# Patient Record
Sex: Male | Born: 1975
Health system: Southern US, Community
[De-identification: ages and names within clinical notes are randomized; demographics above are authoritative.]

## PROBLEM LIST (undated history)

## (undated) DIAGNOSIS — R112 Nausea with vomiting, unspecified: Secondary | ICD-10-CM

## (undated) DIAGNOSIS — R51 Headache: Secondary | ICD-10-CM

## (undated) DIAGNOSIS — Z87442 Personal history of urinary calculi: Secondary | ICD-10-CM

## (undated) DIAGNOSIS — T8859XA Other complications of anesthesia, initial encounter: Secondary | ICD-10-CM

## (undated) DIAGNOSIS — T4145XA Adverse effect of unspecified anesthetic, initial encounter: Secondary | ICD-10-CM

## (undated) DIAGNOSIS — G4730 Sleep apnea, unspecified: Secondary | ICD-10-CM

## (undated) DIAGNOSIS — Z9889 Other specified postprocedural states: Secondary | ICD-10-CM

## (undated) DIAGNOSIS — Z6826 Body mass index (BMI) 26.0-26.9, adult: Secondary | ICD-10-CM

## (undated) DIAGNOSIS — K219 Gastro-esophageal reflux disease without esophagitis: Secondary | ICD-10-CM

## (undated) DIAGNOSIS — M109 Gout, unspecified: Secondary | ICD-10-CM

## (undated) DIAGNOSIS — R519 Headache, unspecified: Secondary | ICD-10-CM

## (undated) HISTORY — DX: Body mass index (BMI) 26.0-26.9, adult: Z68.26

---

## 2007-11-01 ENCOUNTER — Emergency Department: Payer: Self-pay | Admitting: Emergency Medicine

## 2007-11-02 ENCOUNTER — Ambulatory Visit: Payer: Self-pay

## 2013-09-04 ENCOUNTER — Ambulatory Visit: Payer: Self-pay

## 2015-03-08 ENCOUNTER — Other Ambulatory Visit: Payer: Self-pay | Admitting: Physician Assistant

## 2015-03-08 DIAGNOSIS — M25511 Pain in right shoulder: Secondary | ICD-10-CM

## 2015-06-19 ENCOUNTER — Other Ambulatory Visit: Payer: Self-pay | Admitting: Orthopedic Surgery

## 2015-06-19 ENCOUNTER — Encounter: Payer: Self-pay | Admitting: *Deleted

## 2015-06-19 ENCOUNTER — Other Ambulatory Visit: Payer: Self-pay

## 2015-06-19 NOTE — Patient Instructions (Signed)
  Your procedure is scheduled on: 06-27-15 (THURSDAY) Report to [place] To find out your arrival time please call [phone] between 1PM - 3PM on 06-26-15 [facility])  Remember: Instructions that are not followed completely may result in serious medical risk, up to and including death, or upon the discretion of your surgeon and anesthesiologist your surgery may need to be rescheduled.    _X___ 1. Do not eat food or drink liquids after midnight. No gum chewing or hard candies.     _X___ 2. No Alcohol for 24 hours before or after surgery.   ____ 3. Bring all medications with you on the day of surgery if instructed.    _X___ 4. Notify your doctor if there is any change in your medical condition     (cold, fever, infections).     Do not wear jewelry, make-up, hairpins, clips or nail polish.  Do not wear lotions, powders, or perfumes. You may wear deodorant.  Do not shave 48 hours prior to surgery. Men may shave face and neck.  Do not bring valuables to the hospital.    [facility] is not responsible for any belongings or valuables.               Contacts, dentures or bridgework may not be worn into surgery.  Leave your suitcase in the car. After surgery it may be brought to your room.  For patients admitted to the hospital, discharge time is determined by your treatment team.   Patients discharged the day of surgery will not be allowed to drive home.   Please read over the following fact sheets that you were given:      ____ Take these medicines the morning of surgery with A SIP OF WATER:    1. NONE  2.   3.   4.  5.  6.  ____ Fleet Enema (as directed)   _X___ Use CHG Soap as directed  ____ Use inhalers on the day of surgery  ____ Stop metformin 2 days prior to surgery    ____ Take 1/2 of usual insulin dose the night before surgery and none on the morning of surgery.   ____ Stop Coumadin/Plavix/aspirin-N/A  ____ Stop  Anti-inflammatories-NO NSAIDS OR ASPIRIN PRODUCTS-TYLENOL OK TO TAKE   _X___ Stop supplements until after surgery-STOP VITAMIN C NOW  ____ Bring C-Pap to the hospital.

## 2015-06-20 ENCOUNTER — Encounter
Admission: RE | Admit: 2015-06-20 | Discharge: 2015-06-20 | Disposition: A | Discharge status: Home / Self Care | Payer: Worker's Compensation | Source: Ambulatory Visit | Attending: Orthopedic Surgery | Admitting: Orthopedic Surgery

## 2015-06-20 DIAGNOSIS — Z01812 Encounter for preprocedural laboratory examination: Secondary | ICD-10-CM | POA: Insufficient documentation

## 2015-06-20 LAB — PROTIME-INR
INR: 1.14
PROTHROMBIN TIME: 14.8 s (ref 11.4–15.0)

## 2015-06-20 LAB — CBC WITH DIFFERENTIAL/PLATELET
BASOS ABS: 0 10*3/uL (ref 0–0.1)
BASOS PCT: 1 %
EOS ABS: 0.3 10*3/uL (ref 0–0.7)
Eosinophils Relative: 3 %
HEMATOCRIT: 42.5 % (ref 40.0–52.0)
HEMOGLOBIN: 14.7 g/dL (ref 13.0–18.0)
Lymphocytes Relative: 24 %
Lymphs Abs: 2.2 10*3/uL (ref 1.0–3.6)
MCH: 29 pg (ref 26.0–34.0)
MCHC: 34.7 g/dL (ref 32.0–36.0)
MCV: 83.7 fL (ref 80.0–100.0)
MONOS PCT: 7 %
Monocytes Absolute: 0.6 10*3/uL (ref 0.2–1.0)
NEUTROS ABS: 5.9 10*3/uL (ref 1.4–6.5)
NEUTROS PCT: 65 %
Platelets: 305 10*3/uL (ref 150–440)
RBC: 5.08 MIL/uL (ref 4.40–5.90)
RDW: 12.6 % (ref 11.5–14.5)
WBC: 9 10*3/uL (ref 3.8–10.6)

## 2015-06-20 LAB — BASIC METABOLIC PANEL
Anion gap: 9 (ref 5–15)
BUN: 13 mg/dL (ref 6–20)
CALCIUM: 9.5 mg/dL (ref 8.9–10.3)
CO2: 27 mmol/L (ref 22–32)
Chloride: 103 mmol/L (ref 101–111)
Creatinine, Ser: 1.02 mg/dL (ref 0.61–1.24)
GFR calc Af Amer: >60 mL/min (ref >60)
Glucose, Bld: 107 mg/dL — ABNORMAL HIGH (ref 65–99)
POTASSIUM: 3.7 mmol/L (ref 3.5–5.1)
Sodium: 139 mmol/L (ref 135–145)

## 2015-06-20 LAB — APTT: aPTT: 30 seconds (ref 24–36)

## 2015-06-27 ENCOUNTER — Ambulatory Visit
Admission: RE | Admit: 2015-06-27 | Discharge: 2015-06-27 | Disposition: A | Discharge status: Home / Self Care | Payer: Worker's Compensation | Source: Ambulatory Visit | Attending: Orthopedic Surgery | Admitting: Orthopedic Surgery

## 2015-06-27 ENCOUNTER — Ambulatory Visit: Payer: Worker's Compensation | Admitting: Certified Registered Nurse Anesthetist

## 2015-06-27 ENCOUNTER — Encounter: Payer: Self-pay | Admitting: *Deleted

## 2015-06-27 ENCOUNTER — Encounter
Admission: RE | Disposition: A | Discharge status: Home / Self Care | Payer: Self-pay | Source: Ambulatory Visit | Attending: Orthopedic Surgery

## 2015-06-27 DIAGNOSIS — Z888 Allergy status to other drugs, medicaments and biological substances status: Secondary | ICD-10-CM | POA: Diagnosis not present

## 2015-06-27 DIAGNOSIS — M75101 Unspecified rotator cuff tear or rupture of right shoulder, not specified as traumatic: Secondary | ICD-10-CM | POA: Diagnosis not present

## 2015-06-27 DIAGNOSIS — G43909 Migraine, unspecified, not intractable, without status migrainosus: Secondary | ICD-10-CM | POA: Diagnosis not present

## 2015-06-27 DIAGNOSIS — Z79899 Other long term (current) drug therapy: Secondary | ICD-10-CM | POA: Insufficient documentation

## 2015-06-27 DIAGNOSIS — G4730 Sleep apnea, unspecified: Secondary | ICD-10-CM | POA: Diagnosis not present

## 2015-06-27 DIAGNOSIS — M109 Gout, unspecified: Secondary | ICD-10-CM | POA: Insufficient documentation

## 2015-06-27 DIAGNOSIS — X58XXXD Exposure to other specified factors, subsequent encounter: Secondary | ICD-10-CM | POA: Diagnosis not present

## 2015-06-27 DIAGNOSIS — M2240 Chondromalacia patellae, unspecified knee: Secondary | ICD-10-CM | POA: Diagnosis not present

## 2015-06-27 HISTORY — DX: Headache: R51

## 2015-06-27 HISTORY — DX: Headache, unspecified: R51.9

## 2015-06-27 HISTORY — DX: Sleep apnea, unspecified: G47.30

## 2015-06-27 HISTORY — PX: SHOULDER ARTHROSCOPY WITH OPEN ROTATOR CUFF REPAIR: SHX6092

## 2015-06-27 HISTORY — DX: Gout, unspecified: M10.9

## 2015-06-27 SURGERY — ARTHROSCOPY, SHOULDER WITH REPAIR, ROTATOR CUFF, OPEN
Anesthesia: General | Site: Shoulder | Laterality: Right | Wound class: Clean

## 2015-06-27 MED ORDER — MIDAZOLAM HCL 2 MG/2ML IJ SOLN
2.0000 mg | Freq: Once | INTRAMUSCULAR | Status: AC
  Administered 2015-06-27: 2 mg via INTRAVENOUS

## 2015-06-27 MED ORDER — PHENYLEPHRINE HCL 10 MG/ML IJ SOLN
INTRAMUSCULAR | Status: DC | PRN
  Administered 2015-06-27 (×2): 100 ug via INTRAVENOUS
  Administered 2015-06-27: 50 ug via INTRAVENOUS
  Administered 2015-06-27 (×3): 100 ug via INTRAVENOUS

## 2015-06-27 MED ORDER — FAMOTIDINE 20 MG PO TABS
20.0000 mg | ORAL_TABLET | Freq: Once | ORAL | Status: AC
  Administered 2015-06-27: 20 mg via ORAL

## 2015-06-27 MED ORDER — CHLORHEXIDINE GLUCONATE 4 % EX LIQD: 1.0000 "application " | Freq: Once | CUTANEOUS | Status: DC

## 2015-06-27 MED ORDER — SUCCINYLCHOLINE CHLORIDE 20 MG/ML IJ SOLN
INTRAMUSCULAR | Status: DC | PRN
  Administered 2015-06-27: 140 mg via INTRAVENOUS

## 2015-06-27 MED ORDER — EPINEPHRINE HCL 1 MG/ML IJ SOLN
INTRAMUSCULAR | Status: DC | PRN
  Administered 2015-06-27: 15 mL

## 2015-06-27 MED ORDER — ROPIVACAINE HCL 2 MG/ML IJ SOLN
INTRAMUSCULAR | Status: AC
  Filled 2015-06-27: qty 10

## 2015-06-27 MED ORDER — ONDANSETRON HCL 4 MG/2ML IJ SOLN
INTRAMUSCULAR | Status: AC
  Filled 2015-06-27: qty 2

## 2015-06-27 MED ORDER — MIDAZOLAM HCL 5 MG/5ML IJ SOLN
INTRAMUSCULAR | Status: AC
  Administered 2015-06-27: 2 mg via INTRAVENOUS
  Filled 2015-06-27: qty 5

## 2015-06-27 MED ORDER — ONDANSETRON HCL 4 MG/2ML IJ SOLN
INTRAMUSCULAR | Status: DC | PRN
  Administered 2015-06-27: 4 mg via INTRAVENOUS

## 2015-06-27 MED ORDER — PROPOFOL 10 MG/ML IV BOLUS
INTRAVENOUS | Status: DC | PRN
  Administered 2015-06-27: 200 mg via INTRAVENOUS

## 2015-06-27 MED ORDER — ACETAMINOPHEN 10 MG/ML IV SOLN
INTRAVENOUS | Status: AC
  Filled 2015-06-27: qty 100

## 2015-06-27 MED ORDER — BUPIVACAINE HCL 0.25 % IJ SOLN
INTRAMUSCULAR | Status: DC | PRN
  Administered 2015-06-27: 30 mL

## 2015-06-27 MED ORDER — ROPIVACAINE HCL 2 MG/ML IJ SOLN
INTRAMUSCULAR | Status: AC
  Filled 2015-06-27: qty 20

## 2015-06-27 MED ORDER — EPINEPHRINE HCL 1 MG/ML IJ SOLN
INTRAMUSCULAR | Status: AC
  Filled 2015-06-27: qty 1

## 2015-06-27 MED ORDER — ONDANSETRON HCL 4 MG/2ML IJ SOLN
4.0000 mg | Freq: Once | INTRAMUSCULAR | Status: AC | PRN
  Administered 2015-06-27: 4 mg via INTRAVENOUS

## 2015-06-27 MED ORDER — MIDAZOLAM HCL 2 MG/2ML IJ SOLN
1.0000 mg | Freq: Once | INTRAMUSCULAR | Status: AC
  Administered 2015-06-27: 1 mg via INTRAVENOUS

## 2015-06-27 MED ORDER — MIDAZOLAM HCL 5 MG/5ML IJ SOLN
1.0000 mg | Freq: Once | INTRAMUSCULAR | Status: AC
  Administered 2015-06-27: 1 mg via INTRAVENOUS
  Filled 2015-06-27: qty 1

## 2015-06-27 MED ORDER — ONDANSETRON HCL 4 MG PO TABS: 4.0000 mg | ORAL_TABLET | Freq: Three times a day (TID) | ORAL | Status: DC | PRN

## 2015-06-27 MED ORDER — BUPIVACAINE HCL (PF) 0.25 % IJ SOLN
INTRAMUSCULAR | Status: AC
  Filled 2015-06-27: qty 30

## 2015-06-27 MED ORDER — NEOMYCIN-POLYMYXIN B GU 40-200000 IR SOLN
Status: DC | PRN
  Administered 2015-06-27: 2 mL

## 2015-06-27 MED ORDER — DEXAMETHASONE SODIUM PHOSPHATE 10 MG/ML IJ SOLN
INTRAMUSCULAR | Status: DC | PRN
  Administered 2015-06-27: 5 mg via INTRAVENOUS

## 2015-06-27 MED ORDER — LIDOCAINE HCL 1 % IJ SOLN
INTRAMUSCULAR | Status: DC | PRN
  Administered 2015-06-27: 13 mL

## 2015-06-27 MED ORDER — ACETAMINOPHEN 10 MG/ML IV SOLN
INTRAVENOUS | Status: DC | PRN
  Administered 2015-06-27: 1000 mg via INTRAVENOUS

## 2015-06-27 MED ORDER — CEFAZOLIN SODIUM-DEXTROSE 2-3 GM-% IV SOLR
2.0000 g | INTRAVENOUS | Status: AC
  Administered 2015-06-27: 2 g via INTRAVENOUS

## 2015-06-27 MED ORDER — FENTANYL CITRATE (PF) 100 MCG/2ML IJ SOLN
INTRAMUSCULAR | Status: DC | PRN
  Administered 2015-06-27 (×4): 50 ug via INTRAVENOUS

## 2015-06-27 MED ORDER — LIDOCAINE HCL (PF) 1 % IJ SOLN
INTRAMUSCULAR | Status: DC
Start: 2015-06-27 — End: 2015-06-27
  Filled 2015-06-27: qty 5

## 2015-06-27 MED ORDER — LIDOCAINE HCL (PF) 1 % IJ SOLN
INTRAMUSCULAR | Status: AC
  Filled 2015-06-27: qty 30

## 2015-06-27 MED ORDER — LIDOCAINE HCL (CARDIAC) 20 MG/ML IV SOLN
INTRAVENOUS | Status: DC | PRN
  Administered 2015-06-27: 60 mg via INTRAVENOUS

## 2015-06-27 MED ORDER — OXYCODONE HCL 5 MG PO TABS: 5.0000 mg | ORAL_TABLET | ORAL | Status: DC | PRN

## 2015-06-27 MED ORDER — FENTANYL CITRATE (PF) 100 MCG/2ML IJ SOLN: 25.0000 ug | INTRAMUSCULAR | Status: DC | PRN

## 2015-06-27 MED ORDER — HYDROMORPHONE HCL 1 MG/ML IJ SOLN: 0.2500 mg | INTRAMUSCULAR | Status: DC | PRN

## 2015-06-27 MED ORDER — FAMOTIDINE 20 MG PO TABS
ORAL_TABLET | ORAL | Status: AC
  Administered 2015-06-27: 20 mg via ORAL
  Filled 2015-06-27: qty 1

## 2015-06-27 MED ORDER — ROCURONIUM BROMIDE 100 MG/10ML IV SOLN
INTRAVENOUS | Status: DC | PRN
  Administered 2015-06-27: 20 mg via INTRAVENOUS

## 2015-06-27 MED ORDER — NEOMYCIN-POLYMYXIN B GU 40-200000 IR SOLN
Status: AC
  Filled 2015-06-27: qty 2

## 2015-06-27 MED ORDER — CEFAZOLIN SODIUM-DEXTROSE 2-3 GM-% IV SOLR
INTRAVENOUS | Status: AC
  Filled 2015-06-27: qty 50

## 2015-06-27 MED ORDER — SUGAMMADEX SODIUM 200 MG/2ML IV SOLN
INTRAVENOUS | Status: DC | PRN
  Administered 2015-06-27: 200 mg via INTRAVENOUS

## 2015-06-27 MED ORDER — LACTATED RINGERS IV SOLN
INTRAVENOUS | Status: DC
  Administered 2015-06-27: 50 mL/h via INTRAVENOUS

## 2015-06-27 SURGICAL SUPPLY — 70 items
ADAPTER IRRIG TUBE 2 SPIKE SOL (ADAPTER) ×6 IMPLANT
ANCHOR ALL-SUT Q-FIX 2.8 (Anchor) ×3 IMPLANT
ANCHOR SUT BIOC ST 3X145 (SUTURE) ×6 IMPLANT
BUR RADIUS 4.0X18.5 (BURR) ×3 IMPLANT
BUR RADIUS 5.5 (BURR) ×3 IMPLANT
CANNULA 5.75X7 CRYSTAL CLEAR (CANNULA) IMPLANT
CANNULA PARTIAL THREAD 2X7 (CANNULA) ×3 IMPLANT
CANNULA TWIST IN 8.25X9CM (CANNULA) IMPLANT
CLOSURE WOUND 1/2 X4 (GAUZE/BANDAGES/DRESSINGS) ×1
CONNECTOR PERFECT PASSER (CONNECTOR) ×3 IMPLANT
COOLER POLAR GLACIER W/PUMP (MISCELLANEOUS) ×3 IMPLANT
CRADLE LAMINECT ARM (MISCELLANEOUS) ×3 IMPLANT
DRAPE IMP U-DRAPE 54X76 (DRAPES) ×6 IMPLANT
DRAPE INCISE IOBAN 66X45 STRL (DRAPES) ×3 IMPLANT
DRAPE U-SHAPE 47X51 STRL (DRAPES) ×3 IMPLANT
DURAPREP 26ML APPLICATOR (WOUND CARE) ×9 IMPLANT
ELECT REM PT RETURN 9FT ADLT (ELECTROSURGICAL) ×3
ELECTRODE REM PT RTRN 9FT ADLT (ELECTROSURGICAL) ×1 IMPLANT
GAUZE PETRO XEROFOAM 1X8 (MISCELLANEOUS) ×3 IMPLANT
GAUZE SPONGE 4X4 12PLY STRL (GAUZE/BANDAGES/DRESSINGS) ×6 IMPLANT
GLOVE BIOGEL PI IND STRL 9 (GLOVE) ×1 IMPLANT
GLOVE BIOGEL PI INDICATOR 9 (GLOVE) ×2
GLOVE SURG 9.0 ORTHO LTXF (GLOVE) ×6 IMPLANT
GOWN STRL REUS TWL 2XL XL LVL4 (GOWN DISPOSABLE) ×3 IMPLANT
GOWN STRL REUS W/ TWL LRG LVL3 (GOWN DISPOSABLE) ×1 IMPLANT
GOWN STRL REUS W/ TWL LRG LVL4 (GOWN DISPOSABLE) ×1 IMPLANT
GOWN STRL REUS W/TWL LRG LVL3 (GOWN DISPOSABLE) ×2
GOWN STRL REUS W/TWL LRG LVL4 (GOWN DISPOSABLE) ×2
IV LACTATED RINGER IRRG 3000ML (IV SOLUTION) ×30
IV LR IRRIG 3000ML ARTHROMATIC (IV SOLUTION) ×15 IMPLANT
KIT RM TURNOVER STRD PROC AR (KITS) ×3 IMPLANT
KIT STABILIZATION SHOULDER (MISCELLANEOUS) ×3 IMPLANT
KIT SUTURE 1.8 Q-FIX DISP (KITS) IMPLANT
KIT SUTURE 2.8 Q-FIX DISP (MISCELLANEOUS) ×3 IMPLANT
KIT SUTURETAK 3.0 INSERT PERC (KITS) ×3 IMPLANT
MANIFOLD NEPTUNE II (INSTRUMENTS) ×3 IMPLANT
MASK FACE SPIDER DISP (MASK) ×3 IMPLANT
MAT BLUE FLOOR 46X72 FLO (MISCELLANEOUS) ×6 IMPLANT
NDL SAFETY 18GX1.5 (NEEDLE) ×3 IMPLANT
NDL SAFETY 22GX1.5 (NEEDLE) ×3 IMPLANT
NS IRRIG 500ML POUR BTL (IV SOLUTION) ×3 IMPLANT
PACK ARTHROSCOPY SHOULDER (MISCELLANEOUS) ×3 IMPLANT
PAD WRAPON POLAR SHDR UNIV (MISCELLANEOUS) ×1 IMPLANT
PASSER SUT CAPTURE FIRST (SUTURE) ×3 IMPLANT
SET TUBE SUCT SHAVER OUTFL 24K (TUBING) ×3 IMPLANT
SET TUBE TIP INTRA-ARTICULAR (MISCELLANEOUS) ×3 IMPLANT
SLING ULTRA II LG (MISCELLANEOUS) ×3 IMPLANT
STRAP SAFETY BODY (MISCELLANEOUS) ×3 IMPLANT
STRIP CLOSURE SKIN 1/2X4 (GAUZE/BANDAGES/DRESSINGS) ×2 IMPLANT
SUT ETHILON 4-0 (SUTURE) ×2
SUT ETHILON 4-0 FS2 18XMFL BLK (SUTURE) ×1
SUT KNTLS 2.8 MAGNUM (Anchor) ×6 IMPLANT
SUT LASSO 90 DEG SD STR (SUTURE) ×3 IMPLANT
SUT MNCRL 4-0 (SUTURE) ×2
SUT MNCRL 4-0 27XMFL (SUTURE) ×1
SUT PDS AB 0 CT1 27 (SUTURE) ×6 IMPLANT
SUT PERFECTPASSER WHITE CART (SUTURE) ×6 IMPLANT
SUT VIC AB 0 CT1 36 (SUTURE) ×6 IMPLANT
SUT VIC AB 2-0 CT2 27 (SUTURE) ×3 IMPLANT
SUTURE ETHLN 4-0 FS2 18XMF BLK (SUTURE) ×1 IMPLANT
SUTURE MAGNUM WIRE 2X48 BLK (SUTURE) IMPLANT
SUTURE MNCRL 4-0 27XMF (SUTURE) ×1 IMPLANT
SYRINGE 10CC LL (SYRINGE) ×3 IMPLANT
Suture tak anchor, biocomposite ×9 IMPLANT
TAPE MICROFOAM 4IN (TAPE) ×3 IMPLANT
TUBING ARTHRO INFLOW-ONLY STRL (TUBING) ×3 IMPLANT
TUBING CONNECTING 10 (TUBING) ×2 IMPLANT
TUBING CONNECTING 10' (TUBING) ×1
WAND HAND CNTRL MULTIVAC 90 (MISCELLANEOUS) ×3 IMPLANT
WRAPON POLAR PAD SHDR UNIV (MISCELLANEOUS) ×3

## 2015-06-27 NOTE — Discharge Instructions (Signed)

## 2015-06-27 NOTE — Op Note (Signed)
06/27/2015  12:03 PM  PATIENT:  Jason Raymond  40 y.o. male  PRE-OPERATIVE DIAGNOSIS:  M75.101 Unsp rotatr-cuff tear/ruptr of right shoulder, not trauma  POST-OPERATIVE DIAGNOSIS:  right rotator cuff tear and labral tear  PROCEDURE:  Right shoulder arthroscopic anterior labral repair and mini open rotator cuff repair  SURGEON:  Surgeon(s) and Role:    * Thornton Park, MD - Primary  ANESTHESIA:   local, regional and general   PREOPERATIVE INDICATIONS:  Jason Raymond is a  40 y.o. male with a diagnosis of an anterior labral tear and partial thickness tear of the supraspinatus who failed conservative management and elected for surgical management.  He has had persistent pain with overhead activity which is affecting his ability to perform at work. Patient was injured on-the-job pulling wire out of a box which got caught and jammed his shoulder.  He has had persistent right shoulder pain ever since. An MRI has demonstrated an anterior inferior labral tear as well as a partial thickness tear involving the supraspinatus. I have recommended arthroscopic labral repair with a mini open rotator cuff repair.  I discussed the risks and benefits of surgery. The risks include but are not limited to infection, bleeding, nerve or blood vessel injury, joint stiffness or loss of motion, persistent pain, weakness or instability, and hardware failure and the need for further surgery. Medical risks include but are not limited to DVT and pulmonary embolism, myocardial infarction, stroke, pneumonia, respiratory failure and death. Patient understood these risks and wished to proceed.    OPERATIVE IMPLANTS: Arthrex bio suturetak anchors 3  OPERATIVE IMPLANTS: ArthroCare Magnum 2 anchors x 2 & [employer] Q Fix anchors x 1  OPERATIVE FINDINGS:  right shoulder anterior inferior labral tear and high-grade partial-thickness tear involving the supraspinatus  OPERATIVE PROCEDURE:  I met with the patient in the  preoperative area.  I signed the  right shoulder according the hospital's correct site of surgery protocol.  I answered all his questions. Patient was brought to the operating room. He underwent general endotracheal intubation. He was then positioned in a beachchair position. All bony prominences were adequately padded including the lower extremities.  Examination under anesthesia revealed multidirectional instability, including subluxation and the anterior posterior and inferior directions with load and shift testing sulcus sign testing respectively.  The patient was then prepped and draped in a sterile fashion. He received 2 g of Ancef prior to the onset of the case.  A timeout was performed to verify the patient's name, date of birth, medical record number, correct site of surgery and correct procedure to be performed. Was also used to verify the patient received antibiotics that all appropriate instruments, implants and radiographic studies were available in the room. Once all in attendance were in agreement case began.  Bony landmarks were drawn out with a surgical marker along with proposed incisions. These were pre-injected with 1% lidocaine plain. An 11 blade was used to establish a posterior portal through which the arthroscope was placed in the glenohumeral joint. An anterior portal was established under direct visualization using an 18-gauge spinal needle for localization. A 5.75 mm arthroscopic cannula was inserted through the anterior portal. A full diagnostic examination of the glenohumeral joint was performed.  Findings on arthroscopy included detachment of the anterior-inferior labrum and high-grade partial-thickness tear of the supraspinatus. Patient had moderate fraying of the posterior labrum with no detachment from the glenoid. The posterior labral fraying was debrided with a 4 resector shaver blade.  The anterior labrum was mobilized using an arthroscopic elevator. A 4 resector shaver  blade was use to debride scar tissue from the interval between the bony glenoid and labrum. The resector shaver blade was then used and burr mode to debride the anterior glenoid and allow for punctate bleeding to facilitate healing.  An anterolateral portal was established again under direct visualization using an 18-gauge spinal needle. A 8 mm arthroscopic cannula was placed through this anterolateral portal. Both arthroscopic portals were used to place 3 Arthrex Bio Suturetak anchors in the anterior glenoid.  The drill guide was placed through the 8 mm cannula onto the face of the glenoid. A drill hole was created for the implant. A bio suture tack was then malleted into position. The sutures from the anchor were then brought out through the 5.75 mm cannula. A 90 suture lasso was then passed under the labrum. The loop from the suture lasso was brought out through the 5.75 mm cannula. A single limb of the suture anchor was then placed through the loop of the suture lasso and then shuttled under the labrum. It was brought out through the 8 mm cannula. A loop grasper was then used to grab the other limb of the suture anchor and it too was brought out through the 8 mm cannula. Arthroscopic knot was then placed. This process was repeated for the additional 2 anchors. The labrum was asked with 3 anchors between the 5:30 and 3:00 position on the glenoid face.   The attention was then turned to the rotator cuff. The undersurface of the supraspinatus was debrided through the anterior cannula with a 4 resector shaver blade. The torn fibers were debrided allowing better appreciation for the extent of the tear. This was deemed high-grade partial-thickness tear of the supraspinatus. An 18-gauge spinal needle was placed through the tear and a 0 PDS suture was then placed to mark the site of the tear to be identified from the bursal surface.  The arthroscope was then placed in the subacromial space. A lateral portal was  established under direct visualization using an 18-gauge spinal needle. Extensive bursitis was encountered and debrided using a 4-0 resector shaver blade and a 90 ArthroCare wand.  Deep 0 PDS suture was identified. The rotator cuff tear was completed from the bursal side. 2 Perfect Pass sutures were placed in the lateral border of the rotator cuff tear. The greater tuberosity footprint was then debrided with a 4 resector shaver blade of all torn fibers of the rotator until punctate bleeding was identified.    All arthroscopic instruments were then removed and the mini-open portion of the procedure began.   A saber-type incision was made along the lateral border of the acromion. The deltoid muscle was identified and split in line with its fibers which allowed visualization of the rotator cuff. The perfect Pass sutures previously placed in the lateral border of the rotator cuff were brought out through the deltoid split.  One Tamala Julian and [employer] anchor was placed at the articular margin of the humeral head with the greater tuberosity. The suture limbs of the Q Fix anchor were passed medially through the rotator cuff using a First Pass suture passer.  The Smart stitches in the lateral portion of the rotator cuff tear were then anchored to the greater tuberosity footprint using two Magnum 2 anchors. These anchors were tensioned to allow for anatomic reduction of the rotator cuff to the greater tuberosity. The medial row sutures were then tied  down using an arthroscopic knot tying technique.  Arthroscopic images of the repair were taken with the arthroscope both externally and from inside the glenohumeral joint.  All incisions were copiously irrigated. The deltoid fascia was repaired using a 0 Vicryl suture.  The subcutaneous tissue of all incisions were closed with a 2-0 Vicryl. Skin closure for the arthroscopic incisions was performed with 4-0 nylon. The skin edges of the saber incision was approximated with a  running 4-0 undyed Monocryl.  0.25% Marcaine plain was then injected into the subacromial space for postoperative pain control. A dry sterile dressing was applied.  The patient was placed in an abduction sling, with a Polar Caresleeve, a TENS unit.  All sharp and it instrument counts were correct at the conclusion of the case. I was scrubbed and present for the entire case. I spoke with the patient's wife and mother postoperatively to let them know the case had gone without complication and the patient was stable in recovery room.    Timoteo Gaul

## 2015-06-27 NOTE — Transfer of Care (Signed)
Immediate Anesthesia Transfer of Care Note  Patient: [name]  Procedure(s) Performed: Procedure(s) with comments: SHOULDER ARTHROSCOPIC labral repair WITH MINI OPEN ROTATOR CUFF REPAIR (Right) - Arthrocare emailed Rob Bagwell  Sleep Apnea YES  suppose to sleep with CPAP Machine but does not. 7-10 day Return   Patient Location: PACU  Anesthesia Type:General  Level of Consciousness: sedated  Airway & Oxygen Therapy: Patient Spontanous Breathing and Patient connected to nasal cannula oxygen  Post-op Assessment: Report given to RN and Post -op Vital signs reviewed and stable  Post vital signs: Reviewed and stable  Last Vitals:  Filed Vitals:   06/27/15 0745 06/27/15 1157  BP:  119/53  Pulse: 88 70  Temp:  36.1 C  Resp: 20 12    Complications: No apparent anesthesia complications

## 2015-06-27 NOTE — Anesthesia Postprocedure Evaluation (Signed)
Anesthesia Post Note  Patient: Jason Raymond  Procedure(s) Performed: Procedure(s) (LRB): SHOULDER ARTHROSCOPIC labral repair WITH MINI OPEN ROTATOR CUFF REPAIR (Right)  Patient location during evaluation: PACU Anesthesia Type: General Level of consciousness: awake and alert Pain management: pain level controlled Vital Signs Assessment: post-procedure vital signs reviewed and stable Respiratory status: spontaneous breathing, nonlabored ventilation, respiratory function stable and patient connected to nasal cannula oxygen Cardiovascular status: blood pressure returned to baseline and stable Postop Assessment: no signs of nausea or vomiting Anesthetic complications: no    Last Vitals:  Filed Vitals:   06/27/15 1326 06/27/15 1411  BP: 124/67 130/74  Pulse: 96 90  Temp:    Resp: 16 16    Last Pain:  Filed Vitals:   06/27/15 1412  PainSc: [place] Kearstin Learn

## 2015-06-27 NOTE — Anesthesia Procedure Notes (Addendum)
Anesthesia Regional Block:  Interscalene brachial plexus block  Pre-Anesthetic Checklist: ,, timeout performed, Correct Patient, Correct Site, Correct Laterality, Correct Procedure, Correct Position, site marked, Risks and benefits discussed,  Surgical consent,  Pre-op evaluation,  At surgeon's request and post-op pain management  Laterality: Right  Prep: chloraprep       Needles:   Needle Type: Stimulator Needle - 40     Needle Length: 5cm 5 cm Needle Gauge: 21 and 21 G    Additional Needles:  Procedures: ultrasound guided (picture in chart) and nerve stimulator Interscalene brachial plexus block  Nerve Stimulator or Paresthesia:  Response: 70 mA, 3 cm  Additional Responses:   Narrative:  Start time: 06/27/2015 7:15 AM Injection made incrementally with aspirations every 5 mL.  Performed by: Personally  Anesthesiologist: Molli Barrows  Additional Notes: For post op pain per surgeon request.  Risks and Benefits discussed in detail with the patient.  Plan for regional block to assist with postoperative pain management.  Negative aspiration for heme during procedure and no evidence of IV or SA sx.  No pain on injection and tolerated well.  VSST.  Dr. Andree Elk      Procedure Name: Intubation Date/Time: 06/27/2015 7:56 AM Performed by: Johnna Acosta Pre-anesthesia Checklist: Patient identified, Emergency Drugs available, Suction available, Patient being monitored and Timeout performed Patient Re-evaluated:Patient Re-evaluated prior to inductionOxygen Delivery Method: Circle system utilized Preoxygenation: Pre-oxygenation with 100% oxygen Intubation Type: IV induction Ventilation: Mask ventilation without difficulty and Oral airway inserted - appropriate to patient size Laryngoscope Size: Sabra Heck and 2 Grade View: Grade II Tube type: Oral Tube size: 7.5 mm Number of attempts: 1 Airway Equipment and Method: Stylet Placement Confirmation: ETT inserted through vocal cords  under direct vision,  positive ETCO2 and breath sounds checked- equal and bilateral Secured at: 23 cm Tube secured with: Tape Dental Injury: Teeth and Oropharynx as per pre-operative assessment

## 2015-06-27 NOTE — Anesthesia Preprocedure Evaluation (Signed)
Anesthesia Evaluation  Patient identified by MRN, date of birth, ID band Patient awake    Reviewed: Allergy & Precautions, H&P , NPO status , Patient's Chart, lab work & pertinent test results, reviewed documented beta blocker date and time   Airway Mallampati: III  TM Distance: >3 FB Neck ROM: full    Dental  (+) Teeth Intact   Pulmonary neg pulmonary ROS, sleep apnea ,    Pulmonary exam normal        Cardiovascular negative cardio ROS Normal cardiovascular exam Rhythm:regular Rate:Normal     Neuro/Psych negative neurological ROS  negative psych ROS   GI/Hepatic negative GI ROS, Neg liver ROS,   Endo/Other  negative endocrine ROS  Renal/GU negative Renal ROS  negative genitourinary   Musculoskeletal   Abdominal   Peds  Hematology negative hematology ROS (+)   Anesthesia Other Findings Past Medical History:   Headache                                                       Comment:MIGRAINES   Sleep apnea                                                    Comment:NO CPAP-PT STATES HE HAS LOST WEIGHT AND HE               THINKS THIS HAS HELPED    Gout                                                       Past Surgical History:   NO PAST SURGERIES                                           BMI    Body Mass Index   28.23 kg/m 2     Reproductive/Obstetrics negative OB ROS                             Anesthesia Physical Anesthesia Plan  ASA: II  Anesthesia Plan: General ETT   Post-op Pain Management: MAC Combined w/ Regional for Post-op pain   Induction: Intravenous  Airway Management Planned: Video Laryngoscope Planned  Additional Equipment:   Intra-op Plan:   Post-operative Plan:   Informed Consent: I have reviewed the patients History and Physical, chart, labs and discussed the procedure including the risks, benefits and alternatives for the proposed anesthesia with the  patient or authorized representative who has indicated his/her understanding and acceptance.   Dental Advisory Given  Plan Discussed with: CRNA  Anesthesia Plan Comments:         Anesthesia Quick Evaluation

## 2015-06-27 NOTE — H&P (Signed)
The patient has been re-examined, and the chart reviewed, and there have been no interval changes to the documented history and physical.    The risks, benefits, and alternatives have been discussed at length, and the patient is willing to proceed.   

## 2016-08-14 DIAGNOSIS — Z0001 Encounter for general adult medical examination with abnormal findings: Secondary | ICD-10-CM | POA: Diagnosis not present

## 2016-08-14 DIAGNOSIS — E291 Testicular hypofunction: Secondary | ICD-10-CM | POA: Diagnosis not present

## 2016-08-14 DIAGNOSIS — R5383 Other fatigue: Secondary | ICD-10-CM | POA: Diagnosis not present

## 2016-08-14 DIAGNOSIS — L84 Corns and callosities: Secondary | ICD-10-CM | POA: Diagnosis not present

## 2016-08-17 ENCOUNTER — Other Ambulatory Visit: Payer: Self-pay | Admitting: Orthopedic Surgery

## 2016-08-17 DIAGNOSIS — M25511 Pain in right shoulder: Secondary | ICD-10-CM

## 2016-08-21 ENCOUNTER — Ambulatory Visit
Admission: RE | Admit: 2016-08-21 | Discharge: 2016-08-21 | Disposition: A | Discharge status: Home / Self Care | Payer: Self-pay | Source: Ambulatory Visit | Attending: Orthopedic Surgery | Admitting: Orthopedic Surgery

## 2016-08-21 ENCOUNTER — Other Ambulatory Visit: Payer: Self-pay | Admitting: Orthopedic Surgery

## 2016-08-21 DIAGNOSIS — M25519 Pain in unspecified shoulder: Secondary | ICD-10-CM

## 2016-08-31 DIAGNOSIS — B070 Plantar wart: Secondary | ICD-10-CM | POA: Diagnosis not present

## 2016-09-02 ENCOUNTER — Ambulatory Visit
Admission: RE | Admit: 2016-09-02 | Discharge: 2016-09-02 | Disposition: A | Discharge status: Home / Self Care | Payer: Worker's Compensation | Source: Ambulatory Visit | Attending: Orthopedic Surgery | Admitting: Orthopedic Surgery

## 2016-09-02 DIAGNOSIS — M25511 Pain in right shoulder: Secondary | ICD-10-CM

## 2016-09-02 MED ORDER — IOPAMIDOL (ISOVUE-M 200) INJECTION 41%: 15.0000 mL | Freq: Once | INTRAMUSCULAR | Status: DC

## 2016-11-24 ENCOUNTER — Other Ambulatory Visit: Payer: Self-pay | Admitting: Orthopedic Surgery

## 2016-11-27 ENCOUNTER — Encounter
Admission: RE | Admit: 2016-11-27 | Discharge: 2016-11-27 | Disposition: A | Discharge status: Home / Self Care | Payer: Worker's Compensation | Source: Ambulatory Visit | Attending: Orthopedic Surgery | Admitting: Orthopedic Surgery

## 2016-11-27 HISTORY — DX: Other complications of anesthesia, initial encounter: T88.59XA

## 2016-11-27 HISTORY — DX: Other specified postprocedural states: Z98.890

## 2016-11-27 HISTORY — DX: Adverse effect of unspecified anesthetic, initial encounter: T41.45XA

## 2016-11-27 HISTORY — DX: Gastro-esophageal reflux disease without esophagitis: K21.9

## 2016-11-27 HISTORY — DX: Other specified postprocedural states: R11.2

## 2016-11-27 HISTORY — DX: Personal history of urinary calculi: Z87.442

## 2016-11-27 NOTE — Patient Instructions (Addendum)
  Your procedure is scheduled on: 12-08-16 TUESDAY Report to Same Day Surgery 2nd floor medical mall [facility] Entrance-take elevator on left to 2nd floor.  Check in with surgery information desk.) To find out your arrival time please call [phone] between 1PM - 3PM on 12-07-16 MONDAY  Remember: Instructions that are not followed completely may result in serious medical risk, up to and including death, or upon the discretion of your surgeon and anesthesiologist your surgery may need to be rescheduled.    _x___ 1. Do not eat food or drink liquids after midnight. No gum chewing or hard candies.     __x__ 2. No Alcohol for 24 hours before or after surgery.   __x__3. No Smoking for 24 prior to surgery-DO NOT DIP/CHEW AFTER MIDNIGHT   ____  4. Bring all medications with you on the day of surgery if instructed.    __x__ 5. Notify your doctor if there is any change in your medical condition     (cold, fever, infections).     Do not wear jewelry, make-up, hairpins, clips or nail polish.  Do not wear lotions, powders, or perfumes. You may wear deodorant.  Do not shave 48 hours prior to surgery. Men may shave face and neck.  Do not bring valuables to the hospital.    [facility] is not responsible for any belongings or valuables.               Contacts, dentures or bridgework may not be worn into surgery.  Leave your suitcase in the car. After surgery it may be brought to your room.  For patients admitted to the hospital, discharge time is determined by your treatment team.   Patients discharged the day of surgery will not be allowed to drive home.  You will need someone to drive you home and stay with you the night of your procedure.    Please read over the following fact sheets that you were given:    ____ Take anti-hypertensive (unless it includes a diuretic), cardiac, seizure, asthma,     anti-reflux and psychiatric medicines. These include:  1.  NONE  2.  3.  4.  5.  6.  ____Fleets enema or Magnesium Citrate as directed.   _x___ Use CHG Soap or sage wipes as directed on instruction sheet   ____ Use inhalers on the day of surgery and bring to hospital day of surgery  ____ Stop Metformin and Janumet 2 days prior to surgery.    ____ Take 1/2 of usual insulin dose the night before surgery and none on the morning surgery.   ____ Follow recommendations from Cardiologist, Pulmonologist or PCP regarding stopping Aspirin, Coumadin, Pllavix ,Eliquis, Effient, or Pradaxa, and Pletal.  X____Stop Anti-inflammatories such as Advil, Aleve, Ibuprofen, Motrin, Naproxen, Naprosyn, Goodies powders or aspirin products 7 DAYS PRIOR TO SURGERY- OK to take Tylenol   ____ Stop supplements until after surgery.     ____ Bring C-Pap to the hospital.

## 2016-11-30 ENCOUNTER — Encounter
Admission: RE | Admit: 2016-11-30 | Discharge: 2016-11-30 | Disposition: A | Discharge status: Home / Self Care | Payer: Worker's Compensation | Source: Ambulatory Visit | Attending: Orthopedic Surgery | Admitting: Orthopedic Surgery

## 2016-11-30 DIAGNOSIS — M7521 Bicipital tendinitis, right shoulder: Secondary | ICD-10-CM | POA: Diagnosis not present

## 2016-11-30 DIAGNOSIS — Z01812 Encounter for preprocedural laboratory examination: Secondary | ICD-10-CM | POA: Insufficient documentation

## 2016-11-30 LAB — CBC WITH DIFFERENTIAL/PLATELET
BASOS ABS: 0.1 10*3/uL (ref 0–0.1)
BASOS PCT: 1 %
Eosinophils Absolute: 0.3 10*3/uL (ref 0–0.7)
Eosinophils Relative: 5 %
HEMATOCRIT: 44.2 % (ref 40.0–52.0)
HEMOGLOBIN: 15.3 g/dL (ref 13.0–18.0)
Lymphocytes Relative: 29 %
Lymphs Abs: 1.9 10*3/uL (ref 1.0–3.6)
MCH: 29.1 pg (ref 26.0–34.0)
MCHC: 34.5 g/dL (ref 32.0–36.0)
MCV: 84.3 fL (ref 80.0–100.0)
Monocytes Absolute: 0.5 10*3/uL (ref 0.2–1.0)
Monocytes Relative: 9 %
NEUTROS ABS: 3.6 10*3/uL (ref 1.4–6.5)
NEUTROS PCT: 56 %
Platelets: 267 10*3/uL (ref 150–440)
RBC: 5.25 MIL/uL (ref 4.40–5.90)
RDW: 12.9 % (ref 11.5–14.5)
WBC: 6.4 10*3/uL (ref 3.8–10.6)

## 2016-11-30 LAB — BASIC METABOLIC PANEL
Anion gap: 11 (ref 5–15)
BUN: 22 mg/dL — ABNORMAL HIGH (ref 6–20)
CALCIUM: 10 mg/dL (ref 8.9–10.3)
CO2: 27 mmol/L (ref 22–32)
Chloride: 100 mmol/L — ABNORMAL LOW (ref 101–111)
Creatinine, Ser: 1.05 mg/dL (ref 0.61–1.24)
GFR calc Af Amer: >60 mL/min (ref >60)
Glucose, Bld: 83 mg/dL (ref 65–99)
Potassium: 4.4 mmol/L (ref 3.5–5.1)
SODIUM: 138 mmol/L (ref 135–145)

## 2016-11-30 LAB — PROTIME-INR
INR: 1.01
Prothrombin Time: 13.3 seconds (ref 11.4–15.2)

## 2016-11-30 LAB — APTT: APTT: 35 s (ref 24–36)

## 2016-12-07 ENCOUNTER — Encounter: Payer: Self-pay | Admitting: *Deleted

## 2016-12-07 MED ORDER — CEFAZOLIN SODIUM-DEXTROSE 2-4 GM/100ML-% IV SOLN
2.0000 g | INTRAVENOUS | Status: AC
  Administered 2016-12-08: 2 g via INTRAVENOUS

## 2016-12-08 ENCOUNTER — Ambulatory Visit
Admission: RE | Admit: 2016-12-08 | Discharge: 2016-12-08 | Disposition: A | Discharge status: Home / Self Care | Payer: Worker's Compensation | Source: Ambulatory Visit | Attending: Orthopedic Surgery | Admitting: Orthopedic Surgery

## 2016-12-08 ENCOUNTER — Encounter: Payer: Self-pay | Admitting: *Deleted

## 2016-12-08 ENCOUNTER — Ambulatory Visit: Payer: Worker's Compensation | Admitting: Anesthesiology

## 2016-12-08 ENCOUNTER — Encounter
Admission: RE | Disposition: A | Discharge status: Home / Self Care | Payer: Self-pay | Source: Ambulatory Visit | Attending: Orthopedic Surgery

## 2016-12-08 DIAGNOSIS — M7501 Adhesive capsulitis of right shoulder: Secondary | ICD-10-CM | POA: Diagnosis not present

## 2016-12-08 DIAGNOSIS — M7521 Bicipital tendinitis, right shoulder: Secondary | ICD-10-CM | POA: Insufficient documentation

## 2016-12-08 HISTORY — PX: SHOULDER ARTHROSCOPY WITH DEBRIDEMENT AND BICEP TENDON REPAIR: SHX5690

## 2016-12-08 SURGERY — SHOULDER ARTHROSCOPY WITH DEBRIDEMENT AND BICEP TENDON REPAIR
Anesthesia: General | Site: Shoulder | Laterality: Right | Wound class: Clean

## 2016-12-08 MED ORDER — FENTANYL CITRATE (PF) 100 MCG/2ML IJ SOLN
INTRAMUSCULAR | Status: AC
  Filled 2016-12-08: qty 2

## 2016-12-08 MED ORDER — FAMOTIDINE 20 MG PO TABS
20.0000 mg | ORAL_TABLET | Freq: Once | ORAL | Status: AC
  Administered 2016-12-08: 20 mg via ORAL

## 2016-12-08 MED ORDER — EPINEPHRINE PF 1 MG/ML IJ SOLN
INTRAMUSCULAR | Status: DC | PRN
  Administered 2016-12-08: 4 mL

## 2016-12-08 MED ORDER — EPINEPHRINE 30 MG/30ML IJ SOLN
INTRAMUSCULAR | Status: AC
  Filled 2016-12-08: qty 1

## 2016-12-08 MED ORDER — EPINEPHRINE PF 1 MG/ML IJ SOLN
INTRAMUSCULAR | Status: AC
  Filled 2016-12-08: qty 1

## 2016-12-08 MED ORDER — LACTATED RINGERS IV SOLN
INTRAVENOUS | Status: DC
  Administered 2016-12-08: 08:00:00 via INTRAVENOUS

## 2016-12-08 MED ORDER — ROPIVACAINE HCL 5 MG/ML IJ SOLN
INTRAMUSCULAR | Status: DC | PRN
  Administered 2016-12-08: 30 mL via PERINEURAL

## 2016-12-08 MED ORDER — FENTANYL CITRATE (PF) 100 MCG/2ML IJ SOLN
INTRAMUSCULAR | Status: DC | PRN
  Administered 2016-12-08: 50 ug via INTRAVENOUS

## 2016-12-08 MED ORDER — MIDAZOLAM HCL 2 MG/2ML IJ SOLN
1.0000 mg | Freq: Once | INTRAMUSCULAR | Status: AC
  Administered 2016-12-08: 1 mg via INTRAVENOUS

## 2016-12-08 MED ORDER — DEXAMETHASONE SODIUM PHOSPHATE 10 MG/ML IJ SOLN
INTRAMUSCULAR | Status: DC | PRN
  Administered 2016-12-08: 10 mg via INTRAVENOUS

## 2016-12-08 MED ORDER — ACETAMINOPHEN 10 MG/ML IV SOLN
INTRAVENOUS | Status: DC | PRN
  Administered 2016-12-08: 1000 mg via INTRAVENOUS

## 2016-12-08 MED ORDER — LIDOCAINE HCL (PF) 2 % IJ SOLN
INTRAMUSCULAR | Status: AC
  Filled 2016-12-08: qty 2

## 2016-12-08 MED ORDER — PROPOFOL 500 MG/50ML IV EMUL
INTRAVENOUS | Status: DC | PRN
  Administered 2016-12-08: 180 ug/kg/min via INTRAVENOUS

## 2016-12-08 MED ORDER — PROPOFOL 10 MG/ML IV BOLUS
INTRAVENOUS | Status: DC | PRN
  Administered 2016-12-08: 220 mg via INTRAVENOUS

## 2016-12-08 MED ORDER — PROPOFOL 500 MG/50ML IV EMUL
INTRAVENOUS | Status: AC
  Filled 2016-12-08: qty 50

## 2016-12-08 MED ORDER — ROCURONIUM BROMIDE 100 MG/10ML IV SOLN
INTRAVENOUS | Status: DC | PRN
  Administered 2016-12-08: 10 mg via INTRAVENOUS
  Administered 2016-12-08 (×2): 20 mg via INTRAVENOUS
  Administered 2016-12-08: 40 mg via INTRAVENOUS

## 2016-12-08 MED ORDER — CHLORHEXIDINE GLUCONATE CLOTH 2 % EX PADS
6.0000 | MEDICATED_PAD | Freq: Once | CUTANEOUS | Status: AC
  Administered 2016-12-08: 6 via TOPICAL

## 2016-12-08 MED ORDER — SUCCINYLCHOLINE CHLORIDE 20 MG/ML IJ SOLN
INTRAMUSCULAR | Status: AC
  Filled 2016-12-08: qty 1

## 2016-12-08 MED ORDER — NEOMYCIN-POLYMYXIN B GU 40-200000 IR SOLN
Status: DC | PRN
  Administered 2016-12-08: 2 mL

## 2016-12-08 MED ORDER — ONDANSETRON HCL 4 MG PO TABS: 4.0000 mg | ORAL_TABLET | Freq: Three times a day (TID) | ORAL | 0 refills | Status: DC | PRN

## 2016-12-08 MED ORDER — HYDROCODONE-ACETAMINOPHEN 5-325 MG PO TABS: 1.0000 | ORAL_TABLET | Freq: Four times a day (QID) | ORAL | 0 refills | Status: DC | PRN

## 2016-12-08 MED ORDER — MIDAZOLAM HCL 2 MG/2ML IJ SOLN
INTRAMUSCULAR | Status: AC
  Administered 2016-12-08: 1 mg via INTRAVENOUS
  Filled 2016-12-08: qty 2

## 2016-12-08 MED ORDER — LIDOCAINE HCL (PF) 1 % IJ SOLN
INTRAMUSCULAR | Status: AC
  Filled 2016-12-08: qty 30

## 2016-12-08 MED ORDER — ONDANSETRON HCL 4 MG/2ML IJ SOLN
INTRAMUSCULAR | Status: DC | PRN
  Administered 2016-12-08: 4 mg via INTRAVENOUS

## 2016-12-08 MED ORDER — FENTANYL CITRATE (PF) 100 MCG/2ML IJ SOLN
INTRAMUSCULAR | Status: AC
  Administered 2016-12-08: 50 ug via INTRAVENOUS
  Filled 2016-12-08: qty 2

## 2016-12-08 MED ORDER — SUGAMMADEX SODIUM 500 MG/5ML IV SOLN
INTRAVENOUS | Status: AC
  Filled 2016-12-08: qty 5

## 2016-12-08 MED ORDER — LIDOCAINE HCL (CARDIAC) 20 MG/ML IV SOLN
INTRAVENOUS | Status: DC | PRN
  Administered 2016-12-08: 100 mg via INTRAVENOUS

## 2016-12-08 MED ORDER — FAMOTIDINE 20 MG PO TABS
ORAL_TABLET | ORAL | Status: AC
  Administered 2016-12-08: 20 mg via ORAL
  Filled 2016-12-08: qty 1

## 2016-12-08 MED ORDER — SUGAMMADEX SODIUM 500 MG/5ML IV SOLN
INTRAVENOUS | Status: DC | PRN
  Administered 2016-12-08: 250 mg via INTRAVENOUS

## 2016-12-08 MED ORDER — SCOPOLAMINE 1 MG/3DAYS TD PT72
MEDICATED_PATCH | TRANSDERMAL | Status: AC
  Filled 2016-12-08: qty 1

## 2016-12-08 MED ORDER — SUCCINYLCHOLINE CHLORIDE 20 MG/ML IJ SOLN
INTRAMUSCULAR | Status: DC | PRN
  Administered 2016-12-08: 110 mg via INTRAVENOUS

## 2016-12-08 MED ORDER — ROCURONIUM BROMIDE 50 MG/5ML IV SOLN
INTRAVENOUS | Status: AC
  Filled 2016-12-08: qty 2

## 2016-12-08 MED ORDER — ROPIVACAINE HCL 5 MG/ML IJ SOLN
INTRAMUSCULAR | Status: AC
  Filled 2016-12-08: qty 30

## 2016-12-08 MED ORDER — DEXAMETHASONE SODIUM PHOSPHATE 10 MG/ML IJ SOLN
INTRAMUSCULAR | Status: AC
  Filled 2016-12-08: qty 1

## 2016-12-08 MED ORDER — CEFAZOLIN SODIUM-DEXTROSE 2-4 GM/100ML-% IV SOLN
INTRAVENOUS | Status: AC
  Filled 2016-12-08: qty 100

## 2016-12-08 MED ORDER — ACETAMINOPHEN 10 MG/ML IV SOLN
INTRAVENOUS | Status: AC
  Filled 2016-12-08: qty 100

## 2016-12-08 MED ORDER — LIDOCAINE HCL (PF) 1 % IJ SOLN
INTRAMUSCULAR | Status: DC | PRN
  Administered 2016-12-08: 5 mL via SUBCUTANEOUS

## 2016-12-08 MED ORDER — BUPIVACAINE HCL (PF) 0.25 % IJ SOLN
INTRAMUSCULAR | Status: AC
  Filled 2016-12-08: qty 30

## 2016-12-08 MED ORDER — BUPIVACAINE-EPINEPHRINE (PF) 0.25% -1:200000 IJ SOLN
INTRAMUSCULAR | Status: DC | PRN
  Administered 2016-12-08: 15 mL via PERINEURAL

## 2016-12-08 MED ORDER — CHLORHEXIDINE GLUCONATE CLOTH 2 % EX PADS: 6.0000 | MEDICATED_PAD | Freq: Once | CUTANEOUS | Status: DC

## 2016-12-08 MED ORDER — FENTANYL CITRATE (PF) 100 MCG/2ML IJ SOLN
50.0000 ug | Freq: Once | INTRAMUSCULAR | Status: AC
  Administered 2016-12-08: 50 ug via INTRAVENOUS

## 2016-12-08 MED ORDER — LIDOCAINE HCL (PF) 1 % IJ SOLN
INTRAMUSCULAR | Status: AC
  Filled 2016-12-08: qty 5

## 2016-12-08 MED ORDER — NEOMYCIN-POLYMYXIN B GU 40-200000 IR SOLN
Status: AC
  Filled 2016-12-08: qty 2

## 2016-12-08 MED ORDER — ONDANSETRON HCL 4 MG/2ML IJ SOLN
INTRAMUSCULAR | Status: AC
  Filled 2016-12-08: qty 2

## 2016-12-08 SURGICAL SUPPLY — 68 items
ADAPTER IRRIG TUBE 2 SPIKE SOL (ADAPTER) ×6 IMPLANT
BUR RADIUS 4.0X18.5 (BURR) ×3 IMPLANT
BUR RADIUS 5.5 (BURR) ×3 IMPLANT
CANISTER SUCT LVC 12 LTR MEDI- (MISCELLANEOUS) ×3 IMPLANT
CANNULA 5.75X7 CRYSTAL CLEAR (CANNULA) ×3 IMPLANT
CANNULA PARTIAL THREAD 2X7 (CANNULA) IMPLANT
CANNULA TWIST IN 8.25X9CM (CANNULA) IMPLANT
CHLORAPREP W/TINT 26ML (MISCELLANEOUS) ×9 IMPLANT
CLOSURE WOUND 1/2 X4 (GAUZE/BANDAGES/DRESSINGS) ×1
CONNECTOR PERFECT PASSER (CONNECTOR) IMPLANT
COOLER POLAR GLACIER W/PUMP (MISCELLANEOUS) IMPLANT
CRADLE LAMINECT ARM (MISCELLANEOUS) ×6 IMPLANT
DEVICE SUCT BLK HOLE OR FLOOR (MISCELLANEOUS) ×3 IMPLANT
DRAPE IMP U-DRAPE 54X76 (DRAPES) ×6 IMPLANT
DRAPE INCISE IOBAN 66X45 STRL (DRAPES) ×3 IMPLANT
DRAPE SHEET LG 3/4 BI-LAMINATE (DRAPES) ×6 IMPLANT
DRAPE U-SHAPE 47X51 STRL (DRAPES) ×3 IMPLANT
DURAPREP 26ML APPLICATOR (WOUND CARE) IMPLANT
ELECT REM PT RETURN 9FT ADLT (ELECTROSURGICAL) ×3
ELECTRODE REM PT RTRN 9FT ADLT (ELECTROSURGICAL) ×1 IMPLANT
GAUZE PETRO XEROFOAM 1X8 (MISCELLANEOUS) ×3 IMPLANT
GAUZE SPONGE 4X4 12PLY STRL (GAUZE/BANDAGES/DRESSINGS) ×3 IMPLANT
GLOVE BIOGEL PI IND STRL 9 (GLOVE) ×1 IMPLANT
GLOVE BIOGEL PI INDICATOR 9 (GLOVE) ×2
GLOVE SURG 9.0 ORTHO LTXF (GLOVE) ×9 IMPLANT
GOWN STRL REUS TWL 2XL XL LVL4 (GOWN DISPOSABLE) ×3 IMPLANT
GOWN STRL REUS W/ TWL LRG LVL3 (GOWN DISPOSABLE) ×1 IMPLANT
GOWN STRL REUS W/TWL LRG LVL3 (GOWN DISPOSABLE) ×2
IV LACTATED RINGER IRRG 3000ML (IV SOLUTION) ×8
IV LR IRRIG 3000ML ARTHROMATIC (IV SOLUTION) ×4 IMPLANT
KIT RM TURNOVER STRD PROC AR (KITS) ×3 IMPLANT
KIT STABILIZATION SHOULDER (MISCELLANEOUS) ×3 IMPLANT
KIT SUTURE 2.8 Q-FIX DISP (MISCELLANEOUS) ×3 IMPLANT
KIT SUTURETAK 3.0 INSERT PERC (KITS) IMPLANT
MANIFOLD NEPTUNE II (INSTRUMENTS) ×3 IMPLANT
MASK FACE SPIDER DISP (MASK) ×3 IMPLANT
MAT BLUE FLOOR 46X72 FLO (MISCELLANEOUS) ×6 IMPLANT
NDL SAFETY 18GX1.5 (NEEDLE) ×3 IMPLANT
NDL SAFETY 22GX1.5 (NEEDLE) ×3 IMPLANT
NS IRRIG 500ML POUR BTL (IV SOLUTION) ×3 IMPLANT
PACK ARTHROSCOPY SHOULDER (MISCELLANEOUS) ×3 IMPLANT
PAD WRAPON POLAR SHDR XLG (MISCELLANEOUS) IMPLANT
PASSER SUT CAPTURE FIRST (SUTURE) IMPLANT
SET TUBE SUCT SHAVER OUTFL 24K (TUBING) ×3 IMPLANT
SET TUBE TIP INTRA-ARTICULAR (MISCELLANEOUS) ×3 IMPLANT
SLING ARM LRG DEEP (SOFTGOODS) ×3 IMPLANT
STRIP CLOSURE SKIN 1/2X4 (GAUZE/BANDAGES/DRESSINGS) ×2 IMPLANT
SUT ETHILON 4-0 (SUTURE) ×2
SUT ETHILON 4-0 FS2 18XMFL BLK (SUTURE) ×1
SUT LASSO 90 DEG SD STR (SUTURE) IMPLANT
SUT MNCRL 4-0 (SUTURE) ×2
SUT MNCRL 4-0 27XMFL (SUTURE) ×1
SUT PDS AB 0 CT1 27 (SUTURE) ×3 IMPLANT
SUT PERFECTPASSER WHITE CART (SUTURE) IMPLANT
SUT SMART STITCH CARTRIDGE (SUTURE) IMPLANT
SUT VIC AB 0 CT1 36 (SUTURE) ×3 IMPLANT
SUT VIC AB 2-0 CT2 27 (SUTURE) ×3 IMPLANT
SUTURE ETHLN 4-0 FS2 18XMF BLK (SUTURE) ×1 IMPLANT
SUTURE MAGNUM WIRE 2X48 BLK (SUTURE) IMPLANT
SUTURE MNCRL 4-0 27XMF (SUTURE) ×1 IMPLANT
SYR TB 1ML 27GX1/2 LL (SYRINGE) ×3 IMPLANT
SYRINGE 10CC LL (SYRINGE) ×3 IMPLANT
TAPE MICROFOAM 4IN (TAPE) ×3 IMPLANT
TUBING ARTHRO INFLOW-ONLY STRL (TUBING) ×3 IMPLANT
TUBING CONNECTING 10 (TUBING) ×2 IMPLANT
TUBING CONNECTING 10' (TUBING) ×1
WAND HAND CNTRL MULTIVAC 90 (MISCELLANEOUS) ×3 IMPLANT
WRAPON POLAR PAD SHDR XLG (MISCELLANEOUS)

## 2016-12-08 NOTE — Transfer of Care (Signed)
Immediate Anesthesia Transfer of Care Note  Patient: Jason Raymond  Procedure(s) Performed: Procedure(s): SHOULDER ARTHROSCOPY WITH LYSIS OF ADHESIONS, AND  CAPSULOTOMY. (Right)  Patient Location: PACU  Anesthesia Type:General  Level of Consciousness: awake  Airway & Oxygen Therapy: Patient connected to face mask oxygen  Post-op Assessment: Post -op Vital signs reviewed and stable  Post vital signs: stable  Last Vitals:  Vitals:   12/08/16 0825 12/08/16 1021  BP: (!) 140/91 115/84  Pulse: 71 72  Resp: 14   Temp: 37.1 C (!) 36.2 C  SpO2: 100% 97%    Last Pain:  Vitals:   12/08/16 1021  TempSrc: Temporal  PainSc:          Complications: No apparent anesthesia complications

## 2016-12-08 NOTE — H&P (Signed)
The patient has been re-examined, and the chart reviewed, and there have been no interval changes to the documented history and physical.    The risks, benefits, and alternatives have been discussed at length, and the patient is willing to proceed.   

## 2016-12-08 NOTE — Anesthesia Preprocedure Evaluation (Signed)
Anesthesia Evaluation  Patient identified by MRN, date of birth, ID band Patient awake    Reviewed: Allergy & Precautions, H&P , NPO status , Patient's Chart, lab work & pertinent test results, reviewed documented beta blocker date and time   History of Anesthesia Complications (+) PONV and history of anesthetic complications  Airway Mallampati: III  TM Distance: >3 FB Neck ROM: full    Dental  (+) Teeth Intact, Caps, Dental Advidsory Given   Pulmonary neg shortness of breath, sleep apnea , neg COPD, neg recent URI,           Cardiovascular Exercise Tolerance: Good negative cardio ROS       Neuro/Psych negative neurological ROS  negative psych ROS   GI/Hepatic Neg liver ROS, GERD  Controlled,  Endo/Other  negative endocrine ROS  Renal/GU Renal disease (kidney stones)  negative genitourinary   Musculoskeletal   Abdominal   Peds  Hematology negative hematology ROS (+)   Anesthesia Other Findings Past Medical History:   Headache                                                       Comment:MIGRAINES   Sleep apnea                                                    Comment:NO CPAP-PT STATES HE HAS LOST WEIGHT AND HE               THINKS THIS HAS HELPED    Gout                                                       Past Surgical History:   NO PAST SURGERIES                                           BMI    Body Mass Index   28.23 kg/m 2     Reproductive/Obstetrics negative OB ROS                             Anesthesia Physical  Anesthesia Plan  ASA: II  Anesthesia Plan: General ETT   Post-op Pain Management:  Regional for Post-op pain   Induction: Intravenous  PONV Risk Score and Plan: 3 and Ondansetron, Dexamethasone, Midazolam and Propofol infusion  Airway Management Planned: Video Laryngoscope Planned  Additional Equipment:   Intra-op Plan:   Post-operative Plan:  Extubation in OR  Informed Consent: I have reviewed the patients History and Physical, chart, labs and discussed the procedure including the risks, benefits and alternatives for the proposed anesthesia with the patient or authorized representative who has indicated his/her understanding and acceptance.   Dental Advisory Given  Plan Discussed with: CRNA  Anesthesia Plan Comments:         Anesthesia Quick Evaluation

## 2016-12-08 NOTE — Discharge Instructions (Signed)
  AMBULATORY SURGERY  DISCHARGE INSTRUCTIONS   1) The drugs that you were given will stay in your system until tomorrow so for the next 24 hours you should not:  A) Drive an automobile B) Make any legal decisions C) Drink any alcoholic beverage   2) You may resume regular meals tomorrow.  Today it is better to start with liquids and gradually work up to solid foods.  You may eat anything you prefer, but it is better to start with liquids, then soup and crackers, and gradually work up to solid foods.   3) Please notify your doctor immediately if you have any unusual bleeding, trouble breathing, redness and pain at the surgery site, drainage, fever, or pain not relieved by medication.    4) Additional Instructions: TAKE A STOOL SOFTENER TWICE A DAY WHILE TAKING NARCOTIC PAIN MEDICINE TO PREVENT CONSTIPATION   Please contact your physician with any problems or Same Day Surgery at [phone], Monday through Friday 6 am to 4 pm, or [facility] Main number at [phone].   

## 2016-12-08 NOTE — OR Nursing (Signed)
Dr Mack Guise in to see patient, discussed surgery with patient

## 2016-12-08 NOTE — Anesthesia Postprocedure Evaluation (Signed)
Anesthesia Post Note  Patient: Jason Raymond  Procedure(s) Performed: Procedure(s) (LRB): SHOULDER ARTHROSCOPY WITH LYSIS OF ADHESIONS, AND  CAPSULOTOMY. (Right)  Patient location during evaluation: PACU Anesthesia Type: General and Regional Level of consciousness: awake and alert Pain management: pain level controlled Vital Signs Assessment: post-procedure vital signs reviewed and stable Respiratory status: spontaneous breathing, nonlabored ventilation, respiratory function stable and patient connected to nasal cannula oxygen Cardiovascular status: blood pressure returned to baseline and stable Postop Assessment: no signs of nausea or vomiting Anesthetic complications: no     Last Vitals:  Vitals:   12/08/16 1115 12/08/16 1145  BP: 125/87 (!) 141/95  Pulse:  (!) 59  Resp: 16   Temp:    SpO2: 96% 93%    Last Pain:  Vitals:   12/08/16 1109  TempSrc: Temporal  PainSc:                  [name]

## 2016-12-08 NOTE — Anesthesia Procedure Notes (Signed)
Anesthesia Regional Block: Interscalene brachial plexus block   Pre-Anesthetic Checklist: ,, timeout performed, Correct Patient, Correct Site, Correct Laterality, Correct Procedure, Correct Position, site marked, Risks and benefits discussed,  Surgical consent,  Pre-op evaluation,  At surgeon's request and post-op pain management  Laterality: Right and Upper  Prep: chloraprep       Needles:  Injection technique: Single-shot  Needle Type: Stimiplex     Needle Length: 5cm  Needle Gauge: 22     Additional Needles:   Procedures: ultrasound guided,,,,,,,,  Narrative:  Start time: 12/08/2016 8:21 AM End time: 12/08/2016 8:25 AM Injection made incrementally with aspirations every 5 mL.  Performed by: Personally  Anesthesiologist: [name]  Additional Notes: Functioning IV was confirmed and monitors were applied.  A 52mm 22ga Stimuplex needle was used. Sterile prep and drape,hand hygiene and sterile gloves were used.  Negative aspiration and negative test dose prior to incremental administration of local anesthetic. The patient tolerated the procedure well.

## 2016-12-08 NOTE — Op Note (Addendum)
12/08/2016  11:39 AM  PATIENT:  Jason Raymond    PRE-OPERATIVE DIAGNOSIS:  M75.21 Bicipital tendinitis, right shoulder  POST-OPERATIVE DIAGNOSIS:  Right shoulder subacute adhesive capsulitis  PROCEDURE:  RIGHT SHOULDER ARTHROSCOPY WITH LYSIS OF ADHESIONS, AND ANTERIOR SHOULDER DEBRIDEMENT AND CAPSULOTOMY.  SURGEON:  Thornton Park, MD  ANESTHESIA:   General  PREOPERATIVE INDICATIONS:  Jason Raymond is a  41 y.o. male with a diagnosis of chronic right shoulder pain and limited range of motion over a year postop from anterior labral and rotator cuff repair of the right shoulder. Patient had persistent anterior shoulder pain. He benefited transiently from an injection for biceps tendinitis. However given his persistent shoulder pain and loss of motion the decision was made to perform a right shoulder arthroscopy with possible biceps tenodesis versus debridement.  Patient failed conservative measures including NSAIDs, corticosteroid injections and physical therapy and elected for surgical management.    I discussed the risks and benefits of surgery. The risks include but are not limited to infection, bleeding requiring blood transfusion, nerve or blood vessel injury, joint stiffness or loss of motion, persistent pain, weakness or instability, malunion, nonunion and hardware failure and the need for further surgery. Medical risks include but are not limited to DVT and pulmonary embolism, myocardial infarction, stroke, pneumonia, respiratory failure and death. Patient understood these risks and wished to proceed.   OPERATIVE FINDINGS: Exam under anesthesia revealed decreased external rotation and 90 of abduction of approximately 70 under anesthesia today. He also had approximately 30 of external rotation with the arm by his side. His internal rotation was not limited.  Patient does not have any evidence of instability with load and shift testing on exam. He had a negative sulcus sign.  Patient was  found to have abundant scar formation and thickening of the anterior capsule within the rotator interval. There were adhesions involving the subscapularis.  Patient had tightness in the glenohumeral joint during initial insertion of the arthroscope. The biceps tendon was not torn thickened or hyperemic. The anterior labrum repair was intact.  The rotator cuff was well healed. There are no focal chondral lesions. Patient did not have a superior labral tear.  OPERATIVE PROCEDURE: Patient was met in the preoperative area. I signed the right shoulder according to the hospital's correct site of surgery protocol. Patient was given an interscalene block in the preoperative area by the anesthesia service.  Patient was brought to the operating room. He underwent general endotracheal intubation. He was then positioned in a beachchair position. All bony prominences radically padded including his lower extremities. A spider arm positioner was used for this case. Patient was prepped and draped in a sterile fashion.  A timeout performed to verify the patient's name, date of birth, medical record number,  correct side of surgery and correct procedure to be performed. The timeout was also used to confirm the patient received antibiotics appropriate instruments were available in the room. Once all in attendance were in agreement case began.  A posterior portal was created using an 11 blade. The arthroscope was inserted in the glenohumeral joint. The glenohumeral joint was found to be tight. An anterior portal was established using an 18-gauge spinal needle for localization. An 11 blade was used to make the anterior portal. A 5.75 mm resector shaver blade was then inserted into the glenohumeral joint. A full diagnostic examination of the shoulder was performed.  This evaluation of the right shoulder reveals abundant thickening of the anterior capsule and scar formation  within the rotator interval. There are adhesions to the  subscapularis. The biceps tendon was not torn or thickened to suggest cinnamon tendinosis. There is no type II SLAP tear. The previously repaired anterior inferior labrum was well healed. The rotator cuff was also a well-healed. There are no focal chondral lesions. There is no instability or excessive laxity of the glenohumeral joint. Patient had no loose bodies or haggle lesion with reevaluation of the inferior recess.  A 4.0 mm resector shaver blade and 90 ArthroCare wand were then used to perform debridement of the rotator interval. A lysis of adhesions was performed. The subscapularis was freed from adhesions and had great improvement of its excursion. The glenohumeral joint is significantly improved spacing following rotator interval release. The release included debridement of the superior humeral ligament.  Care was taken to preserve the anterior medial sling of the biceps.  The biceps was probed following debridement of the rotator interval and demonstrated no instability of the long head of the the biceps tendon within the in the intertransverse groove.  The subscapularis was not torn.  Once debridement of the rotator interval was complete, the arthroscope was placed in the subacromial space. There is no significant bursitis or adhesions encountered. The rotator cuff was probed and found to have no evidence of bursal sided tears. There is no subacromial spurring or signs of impingement noted. All arthroscopic instruments were then removed.  The 2 portals were then closed with interrupted 4-0 nylon. Steri-Strips were applied along with a dry sterile dressing. Patient was given a regular sling. The patient has a [employer] unit at home which he will use postoperatively.  My office will help him set up physical therapy immediately postop by contacting his workers comp provider for approval. He'll follow up with me in 1 week. He was given Norco for pain as well as a Medrol Dosepak to prevent  postoperative inflammation and scar formation. I spoke to the patient's wife postoperatively to let her know the case was performed without complication the patient was stable in recovery room.

## 2016-12-08 NOTE — Anesthesia Procedure Notes (Signed)
Procedure Name: Intubation Date/Time: 12/08/2016 8:47 AM Performed by: Aline Brochure Pre-anesthesia Checklist: Patient identified, Emergency Drugs available, Suction available and Patient being monitored Patient Re-evaluated:Patient Re-evaluated prior to induction Oxygen Delivery Method: Circle system utilized Preoxygenation: Pre-oxygenation with 100% oxygen Induction Type: IV induction Ventilation: Mask ventilation without difficulty Laryngoscope Size: Mac and 4 Grade View: Grade II Tube type: Oral Tube size: 7.5 mm Number of attempts: 1 Airway Equipment and Method: Stylet Placement Confirmation: ETT inserted through vocal cords under direct vision,  positive ETCO2 and breath sounds checked- equal and bilateral Secured at: 23 cm Tube secured with: Tape Dental Injury: Teeth and Oropharynx as per pre-operative assessment

## 2016-12-08 NOTE — Anesthesia Post-op Follow-up Note (Signed)
Anesthesia QCDR form completed.        

## 2017-08-30 ENCOUNTER — Other Ambulatory Visit: Payer: Self-pay | Admitting: Specialist

## 2017-08-30 DIAGNOSIS — S46291A Other injury of muscle, fascia and tendon of other parts of biceps, right arm, initial encounter: Secondary | ICD-10-CM | POA: Diagnosis not present

## 2017-08-30 DIAGNOSIS — M25521 Pain in right elbow: Secondary | ICD-10-CM | POA: Diagnosis not present

## 2017-08-31 ENCOUNTER — Other Ambulatory Visit: Payer: Self-pay

## 2017-08-31 ENCOUNTER — Encounter
Admission: RE | Admit: 2017-08-31 | Discharge: 2017-08-31 | Disposition: A | Discharge status: Home / Self Care | Payer: BLUE CROSS/BLUE SHIELD | Source: Ambulatory Visit | Attending: Specialist | Admitting: Specialist

## 2017-08-31 MED ORDER — CEFAZOLIN SODIUM-DEXTROSE 2-4 GM/100ML-% IV SOLN
2.0000 g | INTRAVENOUS | Status: AC
  Administered 2017-09-01: 2 g via INTRAVENOUS

## 2017-08-31 MED ORDER — CLINDAMYCIN PHOSPHATE 600 MG/50ML IV SOLN
600.0000 mg | INTRAVENOUS | Status: AC
  Administered 2017-09-01: 600 mg via INTRAVENOUS

## 2017-08-31 NOTE — Patient Instructions (Signed)
Your procedure is scheduled on: 09-01-17  Report to Same Day Surgery 2nd floor medical mall [facility] Entrance-take elevator on left to 2nd floor.  Check in with surgery information desk.) To find out your arrival time please call [phone] between 1PM - 3PM on 08-31-17  Remember: Instructions that are not followed completely may result in serious medical risk, up to and including death, or upon the discretion of your surgeon and anesthesiologist your surgery may need to be rescheduled.    _x___ 1. Do not eat food after midnight the night before your procedure. NO GUM OR CANDY AFTER MIDNIGHT.  You may drink clear liquids up to 2 hours before you are scheduled to arrive at the hospital for your procedure.  Do not drink clear liquids within 2 hours of your scheduled arrival to the hospital.  Clear liquids include  --Water or Apple juice without pulp  --Clear carbohydrate beverage such as ClearFast or Gatorade  --Black Coffee or Clear Tea (No milk, no creamers, do not add anything to the coffee or Tea     __x__ 2. No Alcohol for 24 hours before or after surgery.   __x__3. No Smoking or e-cigarettes for 24 prior to surgery.  Do not use any chewable tobacco products for at least 6 hour prior to surgery   ____  4. Bring all medications with you on the day of surgery if instructed.    __x__ 5. Notify your doctor if there is any change in your medical condition     (cold, fever, infections).    x___6. On the morning of surgery brush your teeth with toothpaste and water.  You may rinse your mouth with mouth wash if you wish.  Do not swallow any toothpaste or mouthwash.   Do not wear jewelry, make-up, hairpins, clips or nail polish.  Do not wear lotions, powders, or perfumes. You may wear deodorant.  Do not shave 48 hours prior to surgery. Men may shave face and neck.  Do not bring valuables to the hospital.    [facility] is not responsible for any belongings or valuables.    Contacts, dentures or bridgework may not be worn into surgery.  Leave your suitcase in the car. After surgery it may be brought to your room.  For patients admitted to the hospital, discharge time is determined by your treatment team.  _  Patients discharged the day of surgery will not be allowed to drive home.  You will need someone to drive you home and stay with you the night of your procedure.   ____ Take anti-hypertensive listed below, cardiac, seizure, asthma, anti-reflux and psychiatric medicines. These include:  1. NONE  2.  3.  4.  5.  6.  ____Fleets enema or Magnesium Citrate as directed.   ____ Use CHG Soap or sage wipes as directed on instruction sheet   ____ Use inhalers on the day of surgery and bring to hospital day of surgery  ____ Stop Metformin and Janumet 2 days prior to surgery.    ____ Take 1/2 of usual insulin dose the night before surgery and none on the morning surgery.   ____ Follow recommendations from Cardiologist, Pulmonologist or PCP regarding stopping Aspirin, Coumadin, Plavix ,Eliquis, Effient, or Pradaxa, and Pletal.  X____Stop Anti-inflammatories such as Advil, Aleve, Ibuprofen, Motrin, Naproxen, Naprosyn, Goodies powders or aspirin products NOW-OK to take Tylenol    ____ Stop supplements until after surgery.     ____ Bring C-Pap to the hospital.

## 2017-09-01 ENCOUNTER — Encounter
Admission: RE | Disposition: A | Discharge status: Home / Self Care | Payer: Self-pay | Source: Ambulatory Visit | Attending: Specialist

## 2017-09-01 ENCOUNTER — Other Ambulatory Visit: Payer: Self-pay

## 2017-09-01 ENCOUNTER — Ambulatory Visit: Payer: BLUE CROSS/BLUE SHIELD | Admitting: Registered Nurse

## 2017-09-01 ENCOUNTER — Ambulatory Visit
Admission: RE | Admit: 2017-09-01 | Discharge: 2017-09-01 | Disposition: A | Discharge status: Home / Self Care | Payer: BLUE CROSS/BLUE SHIELD | Source: Ambulatory Visit | Attending: Specialist | Admitting: Specialist

## 2017-09-01 DIAGNOSIS — G4730 Sleep apnea, unspecified: Secondary | ICD-10-CM | POA: Insufficient documentation

## 2017-09-01 DIAGNOSIS — Z79899 Other long term (current) drug therapy: Secondary | ICD-10-CM | POA: Insufficient documentation

## 2017-09-01 DIAGNOSIS — S46211A Strain of muscle, fascia and tendon of other parts of biceps, right arm, initial encounter: Secondary | ICD-10-CM | POA: Diagnosis not present

## 2017-09-01 DIAGNOSIS — K219 Gastro-esophageal reflux disease without esophagitis: Secondary | ICD-10-CM | POA: Insufficient documentation

## 2017-09-01 DIAGNOSIS — S46291A Other injury of muscle, fascia and tendon of other parts of biceps, right arm, initial encounter: Secondary | ICD-10-CM | POA: Diagnosis not present

## 2017-09-01 DIAGNOSIS — X509XXA Other and unspecified overexertion or strenuous movements or postures, initial encounter: Secondary | ICD-10-CM | POA: Diagnosis not present

## 2017-09-01 DIAGNOSIS — S46801A Unspecified injury of other muscles, fascia and tendons at shoulder and upper arm level, right arm, initial encounter: Secondary | ICD-10-CM | POA: Diagnosis not present

## 2017-09-01 HISTORY — PX: DISTAL BICEPS TENDON REPAIR: SHX1461

## 2017-09-01 SURGERY — REPAIR, TENDON, BICEPS, DISTAL
Anesthesia: General | Laterality: Right | Wound class: Clean

## 2017-09-01 MED ORDER — GABAPENTIN 400 MG PO CAPS: 400.0000 mg | ORAL_CAPSULE | Freq: Two times a day (BID) | ORAL | 3 refills | Status: DC

## 2017-09-01 MED ORDER — PROMETHAZINE HCL 25 MG/ML IJ SOLN: 6.2500 mg | INTRAMUSCULAR | Status: DC | PRN

## 2017-09-01 MED ORDER — MELOXICAM 7.5 MG PO TABS
15.0000 mg | ORAL_TABLET | ORAL | Status: AC
  Administered 2017-09-01: 15 mg via ORAL

## 2017-09-01 MED ORDER — BUPIVACAINE HCL (PF) 0.5 % IJ SOLN
INTRAMUSCULAR | Status: AC
  Filled 2017-09-01: qty 30

## 2017-09-01 MED ORDER — FENTANYL CITRATE (PF) 100 MCG/2ML IJ SOLN
INTRAMUSCULAR | Status: AC
  Filled 2017-09-01: qty 2

## 2017-09-01 MED ORDER — MIDAZOLAM HCL 2 MG/2ML IJ SOLN
INTRAMUSCULAR | Status: DC | PRN
  Administered 2017-09-01: 2 mg via INTRAVENOUS

## 2017-09-01 MED ORDER — SUCCINYLCHOLINE CHLORIDE 20 MG/ML IJ SOLN
INTRAMUSCULAR | Status: AC
  Filled 2017-09-01: qty 1

## 2017-09-01 MED ORDER — FENTANYL CITRATE (PF) 100 MCG/2ML IJ SOLN
INTRAMUSCULAR | Status: DC | PRN
  Administered 2017-09-01: 50 ug via INTRAVENOUS
  Administered 2017-09-01 (×3): 25 ug via INTRAVENOUS

## 2017-09-01 MED ORDER — FAMOTIDINE 20 MG PO TABS
20.0000 mg | ORAL_TABLET | Freq: Once | ORAL | Status: AC
  Administered 2017-09-01: 20 mg via ORAL

## 2017-09-01 MED ORDER — PROPOFOL 500 MG/50ML IV EMUL
INTRAVENOUS | Status: DC | PRN
  Administered 2017-09-01: 150 ug/kg/min via INTRAVENOUS

## 2017-09-01 MED ORDER — HYDROCODONE-ACETAMINOPHEN 7.5-325 MG PO TABS: 1.0000 | ORAL_TABLET | Freq: Four times a day (QID) | ORAL | 0 refills | Status: DC | PRN

## 2017-09-01 MED ORDER — GABAPENTIN 300 MG PO CAPS
300.0000 mg | ORAL_CAPSULE | ORAL | Status: AC
  Administered 2017-09-01: 300 mg via ORAL

## 2017-09-01 MED ORDER — LIDOCAINE HCL (CARDIAC) PF 100 MG/5ML IV SOSY
PREFILLED_SYRINGE | INTRAVENOUS | Status: DC | PRN
  Administered 2017-09-01: 80 mg via INTRAVENOUS

## 2017-09-01 MED ORDER — FENTANYL CITRATE (PF) 100 MCG/2ML IJ SOLN: 25.0000 ug | INTRAMUSCULAR | Status: DC | PRN

## 2017-09-01 MED ORDER — PROPOFOL 10 MG/ML IV BOLUS
INTRAVENOUS | Status: AC
  Filled 2017-09-01: qty 20

## 2017-09-01 MED ORDER — ONDANSETRON HCL 4 MG/2ML IJ SOLN
INTRAMUSCULAR | Status: AC
  Filled 2017-09-01: qty 2

## 2017-09-01 MED ORDER — PROPOFOL 500 MG/50ML IV EMUL
INTRAVENOUS | Status: AC
  Filled 2017-09-01: qty 50

## 2017-09-01 MED ORDER — LIDOCAINE HCL (PF) 2 % IJ SOLN
INTRAMUSCULAR | Status: AC
Start: 2017-09-01 — End: ?
  Filled 2017-09-01: qty 10

## 2017-09-01 MED ORDER — DIPHENHYDRAMINE HCL 50 MG/ML IJ SOLN
INTRAMUSCULAR | Status: AC
  Filled 2017-09-01: qty 1

## 2017-09-01 MED ORDER — PROPOFOL 10 MG/ML IV BOLUS
INTRAVENOUS | Status: DC | PRN
  Administered 2017-09-01: 200 mg via INTRAVENOUS
  Administered 2017-09-01 (×2): 50 mg via INTRAVENOUS

## 2017-09-01 MED ORDER — BUPIVACAINE HCL (PF) 0.5 % IJ SOLN
INTRAMUSCULAR | Status: DC | PRN
  Administered 2017-09-01: 30 mL

## 2017-09-01 MED ORDER — MIDAZOLAM HCL 2 MG/2ML IJ SOLN
INTRAMUSCULAR | Status: AC
  Filled 2017-09-01: qty 2

## 2017-09-01 MED ORDER — DIPHENHYDRAMINE HCL 50 MG/ML IJ SOLN
INTRAMUSCULAR | Status: DC | PRN
  Administered 2017-09-01: 12.5 mg via INTRAVENOUS

## 2017-09-01 MED ORDER — DEXAMETHASONE SODIUM PHOSPHATE 10 MG/ML IJ SOLN
INTRAMUSCULAR | Status: AC
  Filled 2017-09-01: qty 1

## 2017-09-01 MED ORDER — DEXAMETHASONE SODIUM PHOSPHATE 10 MG/ML IJ SOLN
INTRAMUSCULAR | Status: DC | PRN
  Administered 2017-09-01: 10 mg via INTRAVENOUS

## 2017-09-01 MED ORDER — LACTATED RINGERS IV SOLN
INTRAVENOUS | Status: DC
  Administered 2017-09-01: 1000 mL via INTRAVENOUS

## 2017-09-01 MED ORDER — MELOXICAM 15 MG PO TABS: 15.0000 mg | ORAL_TABLET | Freq: Every day | ORAL | 3 refills | Status: DC

## 2017-09-01 MED ORDER — CHLORHEXIDINE GLUCONATE CLOTH 2 % EX PADS: 6.0000 | MEDICATED_PAD | Freq: Once | CUTANEOUS | Status: DC

## 2017-09-01 MED ORDER — ONDANSETRON HCL 4 MG/2ML IJ SOLN
INTRAMUSCULAR | Status: DC | PRN
  Administered 2017-09-01: 4 mg via INTRAVENOUS

## 2017-09-01 SURGICAL SUPPLY — 47 items
BLADE SURG MINI STRL (BLADE) ×3 IMPLANT
BNDG COHESIVE 4X5 TAN STRL (GAUZE/BANDAGES/DRESSINGS) ×3 IMPLANT
BNDG ESMARK 4X12 TAN STRL LF (GAUZE/BANDAGES/DRESSINGS) ×3 IMPLANT
BUR 3.0X8 (BURR) ×3 IMPLANT
CANISTER SUCT 1200ML W/VALVE (MISCELLANEOUS) ×3 IMPLANT
CHLORAPREP W/TINT 26ML (MISCELLANEOUS) ×6 IMPLANT
CUFF TOURN 18 STER (MISCELLANEOUS) IMPLANT
CUFF TOURN 24 STER (MISCELLANEOUS) ×3 IMPLANT
DRAPE INCISE IOBAN 66X45 STRL (DRAPES) ×3 IMPLANT
ELECT REM PT RETURN 9FT ADLT (ELECTROSURGICAL) ×3
ELECTRODE REM PT RTRN 9FT ADLT (ELECTROSURGICAL) ×1 IMPLANT
GAUZE PETRO XEROFOAM 1X8 (MISCELLANEOUS) ×3 IMPLANT
GAUZE SPONGE 4X4 12PLY STRL (GAUZE/BANDAGES/DRESSINGS) ×3 IMPLANT
GLOVE BIO SURGEON STRL SZ8 (GLOVE) ×6 IMPLANT
GLOVE BIOGEL PI IND STRL 7.5 (GLOVE) ×1 IMPLANT
GLOVE BIOGEL PI IND STRL 8.5 (GLOVE) ×1 IMPLANT
GLOVE BIOGEL PI INDICATOR 7.5 (GLOVE) ×2
GLOVE BIOGEL PI INDICATOR 8.5 (GLOVE) ×2
GLOVE SURG ORTHO 8.0 STRL STRW (GLOVE) ×3 IMPLANT
GOWN STRL REUS W/ TWL LRG LVL3 (GOWN DISPOSABLE) ×1 IMPLANT
GOWN STRL REUS W/TWL LRG LVL3 (GOWN DISPOSABLE) ×2
GOWN STRL REUS W/TWL LRG LVL4 (GOWN DISPOSABLE) ×3 IMPLANT
KIT TURNOVER KIT A (KITS) ×3 IMPLANT
NEEDLE SPNL 20GX3.5 QUINCKE YW (NEEDLE) ×3 IMPLANT
NS IRRIG 500ML POUR BTL (IV SOLUTION) ×3 IMPLANT
PACK EXTREMITY ARMC (MISCELLANEOUS) ×3 IMPLANT
PADDING CAST 4IN STRL (MISCELLANEOUS) ×2
PADDING CAST BLEND 4X4 STRL (MISCELLANEOUS) ×1 IMPLANT
PADDING CAST BLEND 6X4 STRL (MISCELLANEOUS) ×1 IMPLANT
PADDING STRL CAST 6IN (MISCELLANEOUS) ×2
PASSER SUT SWANSON 36MM LOOP (INSTRUMENTS) ×3 IMPLANT
SLING ARM LRG DEEP (SOFTGOODS) ×3 IMPLANT
SPLINT CAST 1 STEP 5X30 WHT (MISCELLANEOUS) ×3 IMPLANT
STAPLER SKIN PROX 35W (STAPLE) ×3 IMPLANT
STOCKINETTE BIAS CUT 6 980064 (GAUZE/BANDAGES/DRESSINGS) ×3 IMPLANT
STOCKINETTE IMPERVIOUS 9X36 MD (GAUZE/BANDAGES/DRESSINGS) ×3 IMPLANT
STOCKINETTE M/LG 89821 (MISCELLANEOUS) IMPLANT
SUT ETHILON 4 0 P 3 18 (SUTURE) IMPLANT
SUT ETHILON 4-0 (SUTURE)
SUT ETHILON 4-0 FS2 18XMFL BLK (SUTURE)
SUT FIBERWIRE #5 38 CONV BLUE (SUTURE) ×3
SUT VIC AB 2-0 SH 27 (SUTURE) ×2
SUT VIC AB 2-0 SH 27XBRD (SUTURE) ×1 IMPLANT
SUT VIC AB 3-0 SH 27 (SUTURE) ×2
SUT VIC AB 3-0 SH 27X BRD (SUTURE) ×1 IMPLANT
SUTURE ETHLN 4-0 FS2 18XMF BLK (SUTURE) IMPLANT
SUTURE FIBERWR #5 38 CONV BLUE (SUTURE) ×1 IMPLANT

## 2017-09-01 NOTE — Anesthesia Procedure Notes (Signed)
Procedure Name: LMA Insertion Date/Time: 09/01/2017 7:47 AM Performed by: Hedda Slade, CRNA Pre-anesthesia Checklist: Patient identified, Patient being monitored, Timeout performed, Emergency Drugs available and Suction available Patient Re-evaluated:Patient Re-evaluated prior to induction Oxygen Delivery Method: Circle system utilized Preoxygenation: Pre-oxygenation with 100% oxygen Induction Type: IV induction Ventilation: Mask ventilation without difficulty LMA: LMA inserted LMA Size: 4.5 Tube type: Oral Number of attempts: 1 Placement Confirmation: positive ETCO2 and breath sounds checked- equal and bilateral Tube secured with: Tape Dental Injury: Teeth and Oropharynx as per pre-operative assessment  Comments: Chip on right front upper tooth prior to airway manipulation

## 2017-09-01 NOTE — Transfer of Care (Signed)
Immediate Anesthesia Transfer of Care Note  Patient: Jason Raymond  Procedure(s) Performed: DISTAL BICEPS TENDON REPAIR (Right )  Patient Location: PACU  Anesthesia Type:General  Level of Consciousness: sedated  Airway & Oxygen Therapy: Patient Spontanous Breathing and Patient connected to nasal cannula oxygen  Post-op Assessment: Report given to RN and Post -op Vital signs reviewed and stable  Post vital signs: Reviewed and stable  Last Vitals:  Vitals Value Taken Time  BP 106/66 09/01/2017  9:28 AM  Temp 36.3 C 09/01/2017  9:28 AM  Pulse 62 09/01/2017  9:28 AM  Resp 12 09/01/2017  9:28 AM  SpO2 100 % 09/01/2017  9:28 AM    Last Pain:  Vitals:   09/01/17 0615  TempSrc: Temporal  PainSc: 3          Complications: No apparent anesthesia complications

## 2017-09-01 NOTE — Discharge Instructions (Signed)
AMBULATORY SURGERY  °DISCHARGE INSTRUCTIONS ° ° °1) The drugs that you were given will stay in your system until tomorrow so for the next 24 hours you should not: ° °A) Drive an automobile °B) Make any legal decisions °C) Drink any alcoholic beverage ° ° °2) You may resume regular meals tomorrow.  Today it is better to start with liquids and gradually work up to solid foods. ° °You may eat anything you prefer, but it is better to start with liquids, then soup and crackers, and gradually work up to solid foods. ° ° °3) Please notify your doctor immediately if you have any unusual bleeding, trouble breathing, redness and pain at the surgery site, drainage, fever, or pain not relieved by medication. ° ° ° °4) Additional Instructions: ° ° ° ° ° ° ° °Please contact your physician with any problems or Same Day Surgery at [phone], Monday through Friday 6 am to 4 pm, or [facility] Main number at [phone]. °

## 2017-09-01 NOTE — H&P (Signed)
THE PATIENT WAS SEEN PRIOR TO SURGERY TODAY.  HISTORY, ALLERGIES, HOME MEDICATIONS AND OPERATIVE PROCEDURE WERE REVIEWED. RISKS AND BENEFITS OF SURGERY DISCUSSED WITH PATIENT AGAIN.  NO CHANGES FROM INITIAL HISTORY AND PHYSICAL NOTED.    

## 2017-09-01 NOTE — Anesthesia Postprocedure Evaluation (Signed)
Anesthesia Post Note  Patient: Jason Raymond  Procedure(s) Performed: DISTAL BICEPS TENDON REPAIR (Right )  Patient location during evaluation: PACU Anesthesia Type: General Level of consciousness: awake and alert Pain management: pain level controlled Vital Signs Assessment: post-procedure vital signs reviewed and stable Respiratory status: spontaneous breathing, nonlabored ventilation, respiratory function stable and patient connected to nasal cannula oxygen Cardiovascular status: blood pressure returned to baseline and stable Postop Assessment: no apparent nausea or vomiting Anesthetic complications: no     Last Vitals:  Vitals:   09/01/17 1024 09/01/17 1045  BP:  118/77  Pulse:  (!) 59  Resp:  16  Temp:  (!) 36.1 C  SpO2: 100% 100%    Last Pain:  Vitals:   09/01/17 1045  TempSrc:   PainSc: 0-No pain                 [name]

## 2017-09-01 NOTE — Anesthesia Post-op Follow-up Note (Signed)
Anesthesia QCDR form completed.        

## 2017-09-01 NOTE — Anesthesia Preprocedure Evaluation (Signed)
Anesthesia Evaluation  Patient identified by MRN, date of birth, ID band Patient awake    Reviewed: Allergy & Precautions, H&P , NPO status , Patient's Chart, lab work & pertinent test results, reviewed documented beta blocker date and time   History of Anesthesia Complications (+) PONV and history of anesthetic complications  Airway Mallampati: III  TM Distance: >3 FB Neck ROM: full    Dental  (+) Teeth Intact, Caps, Dental Advidsory Given   Pulmonary neg shortness of breath, sleep apnea , neg COPD, neg recent URI,           Cardiovascular Exercise Tolerance: Good negative cardio ROS       Neuro/Psych negative neurological ROS  negative psych ROS   GI/Hepatic Neg liver ROS, GERD  Controlled,  Endo/Other  negative endocrine ROS  Renal/GU Renal disease (kidney stones)  negative genitourinary   Musculoskeletal   Abdominal   Peds  Hematology negative hematology ROS (+)   Anesthesia Other Findings Past Medical History:   Headache                                                       Comment:MIGRAINES   Sleep apnea                                                    Comment:NO CPAP-PT STATES HE HAS LOST WEIGHT AND HE               THINKS THIS HAS HELPED    Gout                                                       Past Surgical History:   NO PAST SURGERIES                                           BMI    Body Mass Index   28.23 kg/m 2     Reproductive/Obstetrics negative OB ROS                             Anesthesia Physical  Anesthesia Plan  ASA: II  Anesthesia Plan: General   Post-op Pain Management:  Regional for Post-op pain   Induction: Intravenous  PONV Risk Score and Plan: 3 and Ondansetron, Dexamethasone, Midazolam and TIVA  Airway Management Planned: Video Laryngoscope Planned and LMA  Additional Equipment:   Intra-op Plan:   Post-operative Plan: Extubation in  OR  Informed Consent: I have reviewed the patients History and Physical, chart, labs and discussed the procedure including the risks, benefits and alternatives for the proposed anesthesia with the patient or authorized representative who has indicated his/her understanding and acceptance.   Dental Advisory Given  Plan Discussed with: CRNA  Anesthesia Plan Comments:         Anesthesia Quick Evaluation

## 2017-09-01 NOTE — Op Note (Signed)
09/01/2017  9:21 AM  PATIENT:  Jason Raymond    PRE-OPERATIVE DIAGNOSIS: Acute rupture right distal biceps tendon  POST-OPERATIVE DIAGNOSIS:  Same  PROCEDURE:  DISTAL BICEPS TENDON REPAIR RIGHT  SURGEON:  Park Breed, MD   ANESTHESIA:   General LMA  PREOPERATIVE INDICATIONS:  Jason Raymond is a  42 y.o. male with a diagnosis of complete right distal biceps tendon rupture who elected for surgical management.  Risks and benefits of nonoperative treatment were discussed with him.  The risks benefits and alternatives were discussed with the patient preoperatively including but not limited to the risks of infection, bleeding, nerve injury, cardiopulmonary complications, the need for revision surgery, among others, and the patient was willing to proceed.  OPERATIVE IMPLANTS: None  OPERATIVE FINDINGS: Complete rupture of distal biceps tendon right  EBL: None  TOURNIQUET TIME: 53 MIN  COMPLICATIONS:   None  OPERATIVE PROCEDURE: The patient was brought to the operating room and underwent satisfactory general anesthesia. The operative arm was prepped and draped in sterile fashion. Esmarch was applied and tourniquet inflated to 300 mmHg. A transverse incision was made in the elbow flexion crease. Dissection was carried out bluntly through subcutaneous tissues. Vessels were cauterized. Dissection proximally and medially revealed the retracted stump of the biceps tendon.  There was a large amount of a lot of blood in the tissues which was removed.  A  #5 FiberWire suture was woven through the tendon multiple times. Kelly clamp was then directed through the interval between the radius and ulna until it was palpable posteriorly. A second incision was then made posterolaterally and dissection carried out down to the radius. Retractors were inserted to expose the bicipital tuberosity. Soft tissues were debrided away from here. A bur was used to freshen up the bone over the biceps tuberosity. Drill holes  were then made volarly and dorsally. The FiberWire suture with attached tendon was then pulled through the interval between the radius and ulna. The FiberWire sutures were then passed through the bone tunnels and with the elbow flexed the tendon was tied to the bicipital tuberosity snugly over a good bone bridge. This re-created excellent tension on the biceps. The wounds were both irrigated. Subcutaneous tissue and posterior fascia were closed with 2-0 Vicryl. Skin was closed with staples. 1/2% Marcaine was placed in all wounds. Dry sterile dressing and long-arm posterior splint were applied with the arm flexed to 90. This was taped snuggly and tourniquet deflated with good return of blood flow to the hand. A sling was applied. Patient was awakened and taken to recovery in good condition.  Earnestine Leys, MD

## 2017-09-01 NOTE — OR Nursing (Signed)
Discussed discharge instructions with pt and wife. Both voice understanding. 

## 2017-09-02 ENCOUNTER — Encounter: Payer: Self-pay | Admitting: Specialist

## 2017-09-03 DIAGNOSIS — S46291A Other injury of muscle, fascia and tendon of other parts of biceps, right arm, initial encounter: Secondary | ICD-10-CM | POA: Diagnosis not present

## 2017-09-13 DIAGNOSIS — S46291A Other injury of muscle, fascia and tendon of other parts of biceps, right arm, initial encounter: Secondary | ICD-10-CM | POA: Diagnosis not present

## 2017-10-12 DIAGNOSIS — M25521 Pain in right elbow: Secondary | ICD-10-CM | POA: Diagnosis not present

## 2017-10-14 DIAGNOSIS — M25521 Pain in right elbow: Secondary | ICD-10-CM | POA: Diagnosis not present

## 2017-10-19 DIAGNOSIS — M25521 Pain in right elbow: Secondary | ICD-10-CM | POA: Diagnosis not present

## 2017-10-22 DIAGNOSIS — M25521 Pain in right elbow: Secondary | ICD-10-CM | POA: Diagnosis not present

## 2017-10-25 DIAGNOSIS — M25521 Pain in right elbow: Secondary | ICD-10-CM | POA: Diagnosis not present

## 2017-10-27 DIAGNOSIS — M25521 Pain in right elbow: Secondary | ICD-10-CM | POA: Diagnosis not present

## 2017-11-02 DIAGNOSIS — M25521 Pain in right elbow: Secondary | ICD-10-CM | POA: Diagnosis not present

## 2017-11-04 DIAGNOSIS — M25521 Pain in right elbow: Secondary | ICD-10-CM | POA: Diagnosis not present

## 2017-11-09 DIAGNOSIS — M25521 Pain in right elbow: Secondary | ICD-10-CM | POA: Diagnosis not present

## 2017-11-11 DIAGNOSIS — M25521 Pain in right elbow: Secondary | ICD-10-CM | POA: Diagnosis not present

## 2017-11-23 DIAGNOSIS — M25521 Pain in right elbow: Secondary | ICD-10-CM | POA: Diagnosis not present

## 2018-05-02 IMAGING — MR MR SHOULDER*R* W/CM
6 of 7 series · 33 of 40 positions shown · IV contrast (agent unspecified)
Comparison: MRI right shoulder performed at [REDACTED] [facility] 03/15/2015.

CLINICAL DATA: Right shoulder pain for 2 years. History of prior
surgery in 6049.

EXAM:
MR ARTHROGRAM OF THE RIGHT SHOULDER
TECHNIQUE: Multiplanar, multisequence MR imaging of the right shoulder was
performed following the administration of intra-articular contrast.
CONTRAST:  See Injection Documentation.

[Series 3: T1 fat-sat · axial · 4.0mm · 0.25mm/px · z∈[-54,+55]mm · 7 of 24 slices shown (1 of 4)]
[im 1/24]
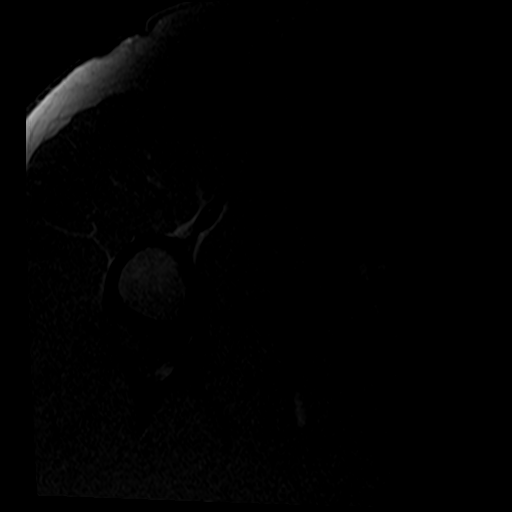
[im 4/24]
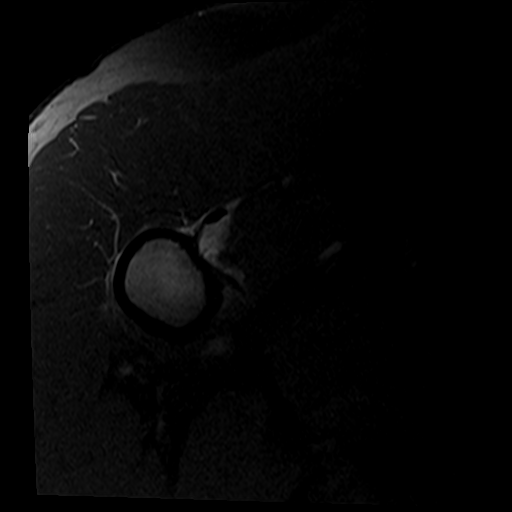
[im 8/24]
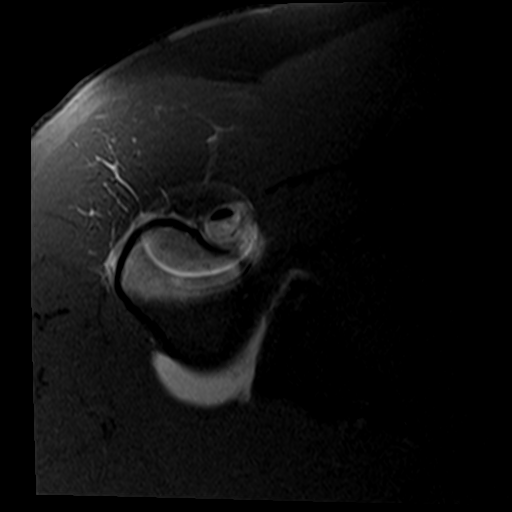
[im 12/24]
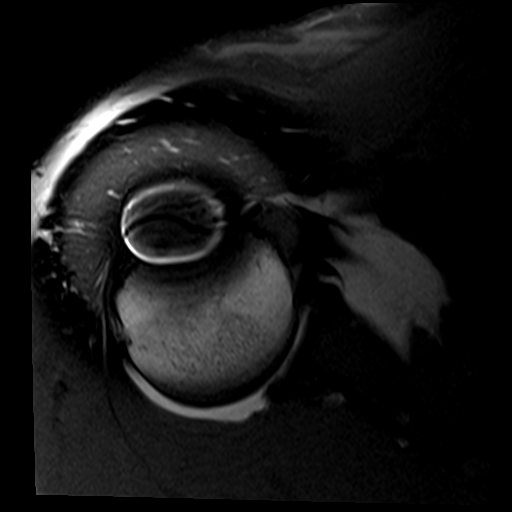
[im 16/24]
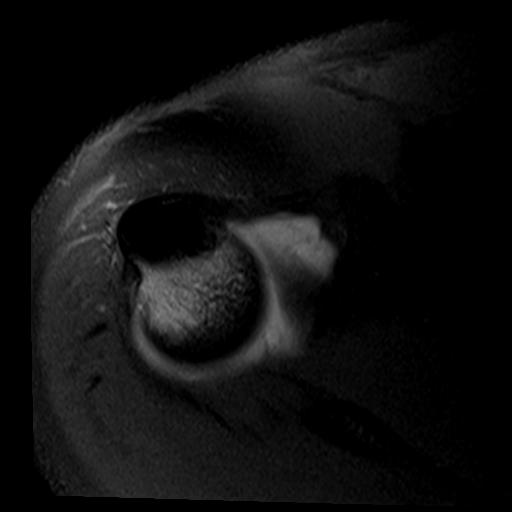
[im 20/24]
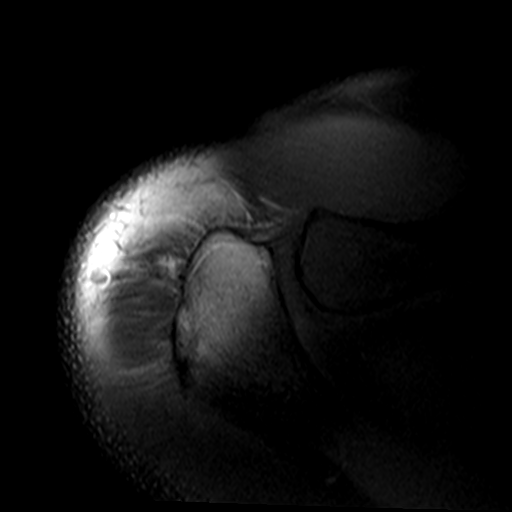
[im 24/24]
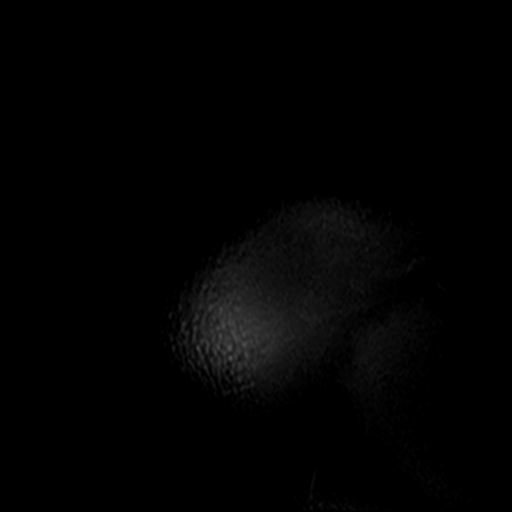

[Series 4: T2 fat-sat · oblique · 4.0mm · 0.55mm/px · 6 of 21 slices shown]
[im 1/21]
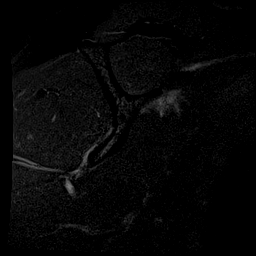
[im 5/21]
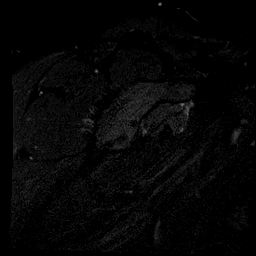
[im 9/21]
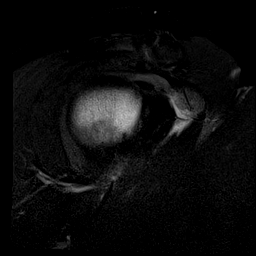
[im 13/21]
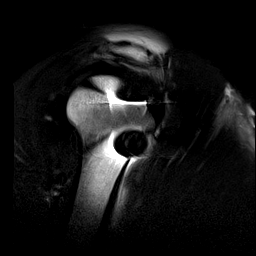
[im 17/21]
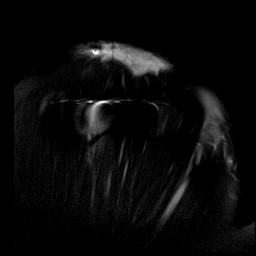
[im 21/21]
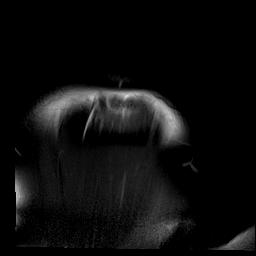

[Series 5: T1 fat-sat · oblique · 4.0mm · 0.44mm/px · 5 of 19 slices shown (2 of 4)]
[im 1/19]
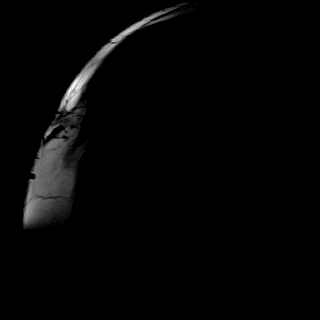
[im 5/19]
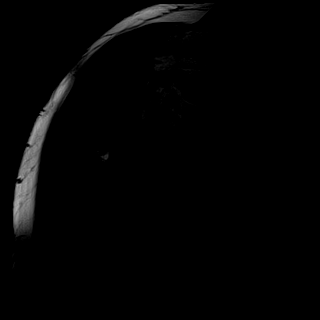
[im 10/19]
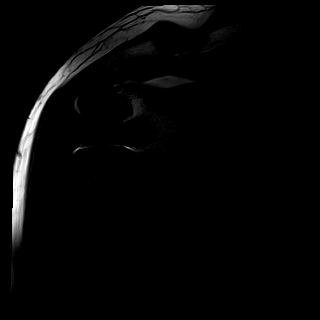
[im 14/19]
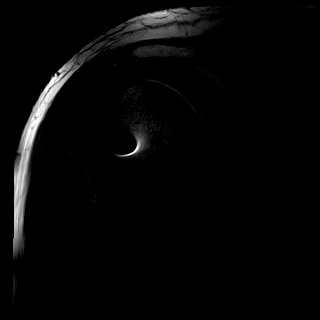
[im 19/19]
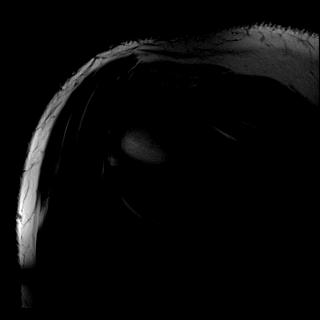

[Series 6: T1 fat-sat · oblique · 4.0mm · 0.55mm/px · 5 of 19 slices shown (3 of 4)]
[im 1/19]
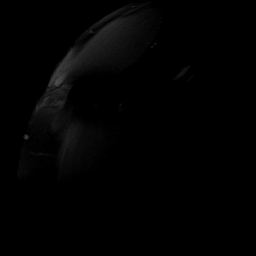
[im 5/19]
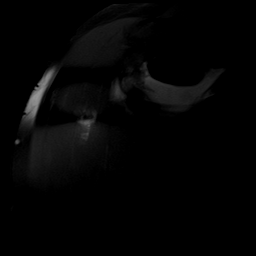
[im 10/19]
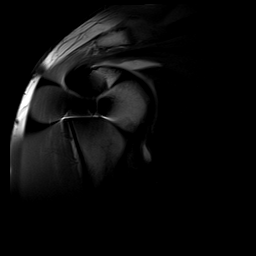
[im 14/19]
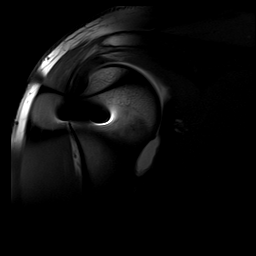
[im 19/19]
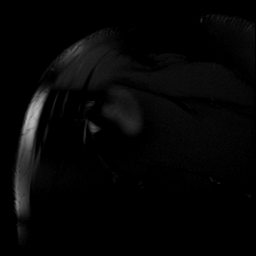

[Series 7: STIR · oblique · 4.0mm · 0.55mm/px · 4 of 19 slices shown]
[im 1/19]
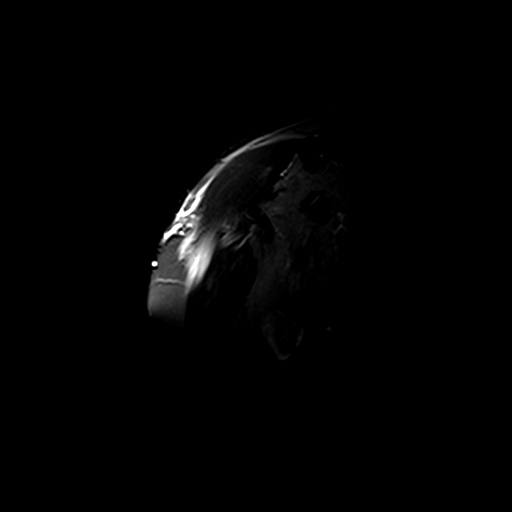
[im 5/19]
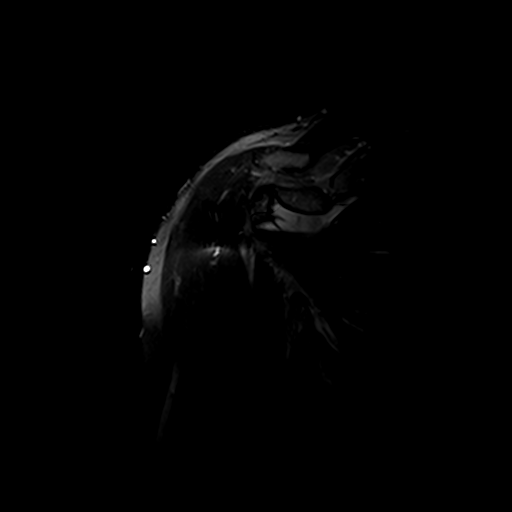
[im 10/19]
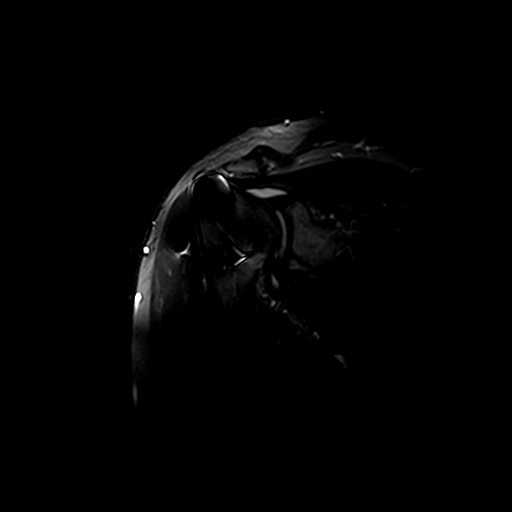
[im 14/19]
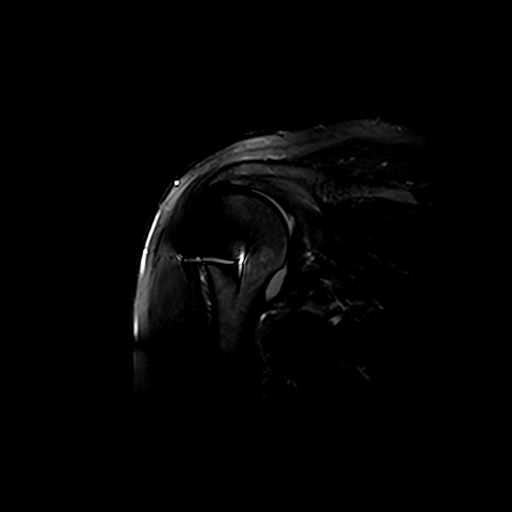

[Series 11: T1 fat-sat · sagittal · 4.0mm · 0.59mm/px · 6 of 21 slices shown (4 of 4)]
[im 1/21]
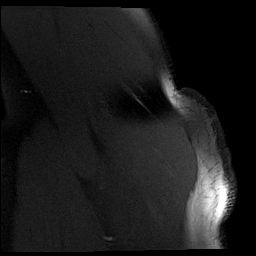
[im 5/21]
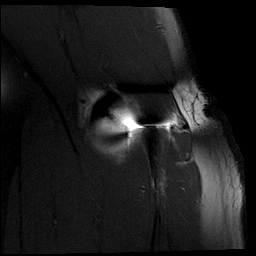
[im 9/21]
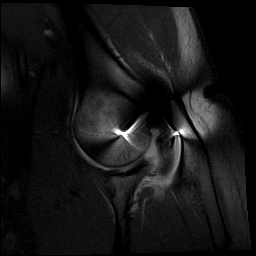
[im 13/21]
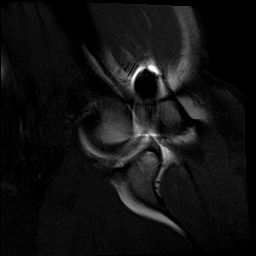
[im 17/21]
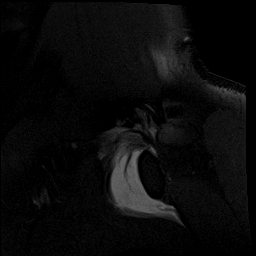
[im 21/21]
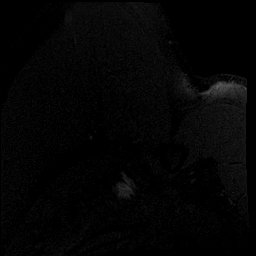

[33 of 40 positions shown; findings below may reference images not displayed]

Plain films right shoulder
09/04/2013. Images from contrast injection reviewed the
FINDINGS: Despite use of artifact reduction techniques, there is fairly
extensive artifact on the exam.

Rotator cuff: The patient is status post rotator cuff repair. The
cuff appears intact. No fluid or contrast is identified in the
subacromial/subdeltoid bursa.

Muscles:  Normal in appearance without atrophy or focal lesion.

Biceps long head: Intact and normal in appearance.

Acromioclavicular Joint: Mild degenerative disease is seen.

Glenohumeral Joint:

Labrum: The patient is status post anterior, inferior labral repair.
The anterior, inferior labrum is irregular. Contrast undermines a
small segment of the anterior, inferior labrum on ABER imaging
worrisome for recurrent tear. The labrum otherwise appears intact.

Bones: Small focal area flattening of subchondral bone is seen at
the anterior, inferior glenoid. No acute bony abnormality.
IMPRESSION: The study is limited by artifact from prior surgery despite using
artifact reduction techniques.

Status post repair of an anterior, inferior labral tear. The
anterior, inferior labrum is irregular with contrast undermining a
small segment of labrum worrisome for recurrent tear and
degeneration.

Irregularity of subchondral bone at the anterior, inferior glenoid
may be due to prior trauma or related to postoperative change. No
Hill-Sachs lesion is identified as typically seen shoulder
dislocation.

Status post rotator cuff repair.  The cuff is intact.

## 2018-07-05 DIAGNOSIS — M7711 Lateral epicondylitis, right elbow: Secondary | ICD-10-CM | POA: Diagnosis not present

## 2018-10-14 ENCOUNTER — Other Ambulatory Visit: Payer: Self-pay

## 2018-10-14 ENCOUNTER — Encounter: Payer: Self-pay | Admitting: Nurse Practitioner

## 2018-10-14 ENCOUNTER — Ambulatory Visit: Payer: BC Managed Care – PPO | Admitting: Nurse Practitioner

## 2018-10-14 VITALS — BP 122/89 | HR 76 | Temp 98.3°F | Resp 16 | Ht 74.0 in | Wt 211.0 lb

## 2018-10-14 DIAGNOSIS — D485 Neoplasm of uncertain behavior of skin: Secondary | ICD-10-CM

## 2018-10-14 NOTE — Progress Notes (Signed)
[facility] [place], [place] [address]  Internal MEDICINE  Office Visit Note  Patient Name: Jason Raymond  [id number]  [id number]  Date of Service: 10/17/2018    Pt is here for a sick visit.  Chief Complaint  Patient presents with  . Medical Management of Chronic Issues    two spots on right arm that wife is concerned about , no pain, no itching      The patient is here for sick visit. Patient has two small areas of concern, both on right arm. One on bicep and one on forearm. Concern for precancerous lesions. They are about 1.5cm in diameter. They have been present for about a year. Patient states they have not changed. They itch some, but do not hurt or cause problems.       Current Medication:  Outpatient Encounter Medications as of 10/14/2018  Medication Sig  . levocetirizine (XYZAL) 5 MG tablet Take 5 mg by mouth every evening.  . [DISCONTINUED] fexofenadine (ALLEGRA) 180 MG tablet Take 180 mg by mouth daily.  . [DISCONTINUED] gabapentin (NEURONTIN) 400 MG capsule Take 1 capsule (400 mg total) by mouth 2 (two) times daily.  . [DISCONTINUED] HYDROcodone-acetaminophen (NORCO) 7.5-325 MG tablet Take 1 tablet by mouth every 6 (six) hours as needed for moderate pain.  . [DISCONTINUED] meloxicam (MOBIC) 15 MG tablet Take 1 tablet (15 mg total) by mouth daily.  . [DISCONTINUED] mometasone (NASONEX) 50 MCG/ACT nasal spray Place 2 sprays into the nose daily.   No facility-administered encounter medications on file as of 10/14/2018.       Medical History: Past Medical History:  Diagnosis Date  . Complication of anesthesia   . GERD (gastroesophageal reflux disease)    no meds  . Gout   . Headache    MIGRAINES  . History of kidney stones   . PONV (postoperative nausea and vomiting)    naueseated AFTER 1ST SURGERY ONLY  . Sleep apnea    NO CPAP-PT STATES HE HAS LOST WEIGHT AND HE THINKS THIS HAS HELPED      Today's Vitals   10/14/18 1122   BP: 122/89  Pulse: 76  Resp: 16  Temp: 98.3 F (36.8 C)  SpO2: 95%  Weight: 211 lb (95.7 kg)  Height: 6\' 2"  (1.88 m)   Body mass index is 27.09 kg/m.  Review of Systems  Constitutional: Negative for chills, fatigue and unexpected weight change.  HENT: Negative for congestion, postnasal drip, rhinorrhea, sneezing and sore throat.   Respiratory: Negative for cough, chest tightness and shortness of breath.   Cardiovascular: Negative for chest pain and palpitations.  Gastrointestinal: Negative for abdominal pain, constipation, diarrhea, nausea and vomiting.  Musculoskeletal: Negative for arthralgias, back pain, joint swelling and neck pain.  Skin: Negative for rash.       Two small areas of concern on the right arm. One on the right inner forearm, the second on the right inner bicep.   Neurological: Negative for tremors, numbness and headaches.  Hematological: Negative for adenopathy. Does not bruise/bleed easily.  Psychiatric/Behavioral: Negative for behavioral problems (Depression), sleep disturbance and suicidal ideas. The patient is not nervous/anxious.     Physical Exam Vitals signs and nursing note reviewed.  Constitutional:      General: He is not in acute distress.    Appearance: Normal appearance. He is well-developed. He is not diaphoretic.  HENT:     Head: Normocephalic and atraumatic.     Mouth/Throat:  Pharynx: No oropharyngeal exudate.  Eyes:     Pupils: Pupils are equal, round, and reactive to light.  Neck:     Musculoskeletal: Normal range of motion and neck supple.     Thyroid: No thyromegaly.     Vascular: No JVD.     Trachea: No tracheal deviation.  Cardiovascular:     Rate and Rhythm: Normal rate and regular rhythm.     Heart sounds: Normal heart sounds. No murmur. No friction rub. No gallop.   Pulmonary:     Effort: Pulmonary effort is normal. No respiratory distress.     Breath sounds: Normal breath sounds. No wheezing or rales.  Chest:     Chest  wall: No tenderness.  Musculoskeletal: Normal range of motion.  Lymphadenopathy:     Cervical: No cervical adenopathy.  Skin:    General: Skin is warm and dry.       Neurological:     Mental Status: He is alert and oriented to person, place, and time.     Cranial Nerves: No cranial nerve deficit.  Psychiatric:        Behavior: Behavior normal.        Thought Content: Thought content normal.        Judgment: Judgment normal.   Assessment/Plan: 1. Neoplasm of uncertain behavior of skin of upper arm Potential for precancerous growth. Refer to dermatology for further evaluation.  - Ambulatory referral to Dermatology  2. Neoplasm of uncertain behavior of skin of forearm Potential for precancerous growth. Refer to dermatology for further evaluation. - Ambulatory referral to Dermatology  General Counseling: Jason Raymond understanding of the findings of todays visit and agrees with plan of treatment. I have discussed any further diagnostic evaluation that may be needed or ordered today. We also reviewed his medications today. he has been encouraged to call the office with any questions or concerns that should arise related to todays visit.    Counseling:  This patient was seen by Leretha Pol FNP Collaboration with Dr Lavera Guise as a part of collaborative care agreement  Orders Placed This Encounter  Procedures  . Ambulatory referral to Dermatology     Time spent: 25 Minutes

## 2018-10-17 DIAGNOSIS — D485 Neoplasm of uncertain behavior of skin: Secondary | ICD-10-CM | POA: Insufficient documentation

## 2018-10-20 DIAGNOSIS — R208 Other disturbances of skin sensation: Secondary | ICD-10-CM | POA: Diagnosis not present

## 2018-10-20 DIAGNOSIS — R234 Changes in skin texture: Secondary | ICD-10-CM | POA: Diagnosis not present

## 2018-10-20 DIAGNOSIS — L281 Prurigo nodularis: Secondary | ICD-10-CM | POA: Diagnosis not present

## 2018-11-17 DIAGNOSIS — M25522 Pain in left elbow: Secondary | ICD-10-CM | POA: Diagnosis not present

## 2018-11-17 DIAGNOSIS — M7712 Lateral epicondylitis, left elbow: Secondary | ICD-10-CM | POA: Diagnosis not present

## 2019-10-04 ENCOUNTER — Ambulatory Visit: Payer: BC Managed Care – PPO | Admitting: Adult Health

## 2019-10-04 ENCOUNTER — Encounter: Payer: Self-pay | Admitting: Adult Health

## 2019-10-04 ENCOUNTER — Other Ambulatory Visit: Payer: Self-pay

## 2019-10-04 VITALS — BP 139/96 | HR 76 | Temp 97.5°F | Resp 16 | Ht 74.0 in | Wt 214.4 lb

## 2019-10-04 DIAGNOSIS — M10021 Idiopathic gout, right elbow: Secondary | ICD-10-CM

## 2019-10-04 DIAGNOSIS — T7840XD Allergy, unspecified, subsequent encounter: Secondary | ICD-10-CM

## 2019-10-04 DIAGNOSIS — M10072 Idiopathic gout, left ankle and foot: Secondary | ICD-10-CM | POA: Diagnosis not present

## 2019-10-04 MED ORDER — COLCHICINE 0.6 MG PO TABS: 0.6000 mg | ORAL_TABLET | Freq: Every day | ORAL | 4 refills | Status: DC

## 2019-10-04 MED ORDER — ALLOPURINOL 100 MG PO TABS: 100.0000 mg | ORAL_TABLET | Freq: Every day | ORAL | 2 refills | Status: DC

## 2019-10-04 MED ORDER — FLUTICASONE PROPIONATE 50 MCG/ACT NA SUSP: 2.0000 | Freq: Two times a day (BID) | NASAL | 2 refills | Status: DC

## 2019-10-04 NOTE — Progress Notes (Signed)
[facility] [place], [place] [address]  Internal MEDICINE  Office Visit Note  Patient Name: Jason Raymond  [id number]  [id number]  Date of Service: 10/04/2019  Chief Complaint  Patient presents with   Gout    left foot, needs medication      HPI Pt is here for a sick visit. He reports last week he had a flare up of gout in his right elbow.  He did ok, but it has since moved to his left foot.  He is having difficulty walking on this foot or wearing a shoe.  He has a history of gout, and has taken allopurinol and colchicine.      Current Medication:  Outpatient Encounter Medications as of 10/04/2019  Medication Sig   levocetirizine (XYZAL) 5 MG tablet Take 5 mg by mouth every evening.   allopurinol (ZYLOPRIM) 100 MG tablet Take 1 tablet (100 mg total) by mouth daily.   colchicine 0.6 MG tablet Take 1 tablet (0.6 mg total) by mouth daily.   No facility-administered encounter medications on file as of 10/04/2019.      Medical History: Past Medical History:  Diagnosis Date   Complication of anesthesia    GERD (gastroesophageal reflux disease)    no meds   Gout    Headache    MIGRAINES   History of kidney stones    PONV (postoperative nausea and vomiting)    naueseated AFTER 1ST SURGERY ONLY   Sleep apnea    NO CPAP-PT STATES HE HAS LOST WEIGHT AND HE THINKS THIS HAS HELPED      Vital Signs: BP (!) 139/96    Pulse 76    Temp (!) 97.5 F (36.4 C)    Resp 16    Ht 6\' 2"  (1.88 m)    Wt 214 lb 6.4 oz (97.3 kg)    SpO2 99%    BMI 27.53 kg/m    Review of Systems  Constitutional: Negative.  Negative for chills, fatigue and unexpected weight change.  HENT: Negative.  Negative for congestion, rhinorrhea, sneezing and sore throat.   Eyes: Negative for redness.  Respiratory: Negative.  Negative for cough, chest tightness and shortness of breath.   Cardiovascular: Negative.  Negative for chest pain and palpitations.  Gastrointestinal:  Negative.  Negative for abdominal pain, constipation, diarrhea, nausea and vomiting.  Endocrine: Negative.   Genitourinary: Negative.  Negative for dysuria and frequency.  Musculoskeletal: Negative.  Negative for arthralgias, back pain, joint swelling and neck pain.  Skin: Negative.  Negative for rash.  Allergic/Immunologic: Negative.   Neurological: Negative.  Negative for tremors and numbness.  Hematological: Negative for adenopathy. Does not bruise/bleed easily.  Psychiatric/Behavioral: Negative.  Negative for behavioral problems, sleep disturbance and suicidal ideas. The patient is not nervous/anxious.     Physical Exam Vitals and nursing note reviewed.  Constitutional:      General: He is not in acute distress.    Appearance: He is well-developed. He is not diaphoretic.  HENT:     Head: Normocephalic and atraumatic.     Mouth/Throat:     Pharynx: No oropharyngeal exudate.  Eyes:     Pupils: Pupils are equal, round, and reactive to light.  Neck:     Thyroid: No thyromegaly.     Vascular: No JVD.     Trachea: No tracheal deviation.  Cardiovascular:     Rate and Rhythm: Normal rate and regular rhythm.     Heart sounds: Normal heart sounds. No  murmur. No friction rub. No gallop.   Pulmonary:     Effort: Pulmonary effort is normal. No respiratory distress.     Breath sounds: Normal breath sounds. No wheezing or rales.  Chest:     Chest wall: No tenderness.  Abdominal:     Palpations: Abdomen is soft.     Tenderness: There is no abdominal tenderness. There is no guarding.  Musculoskeletal:        General: Normal range of motion.     Cervical back: Normal range of motion and neck supple.  Lymphadenopathy:     Cervical: No cervical adenopathy.  Skin:    General: Skin is warm and dry.  Neurological:     Mental Status: He is alert and oriented to person, place, and time.     Cranial Nerves: No cranial nerve deficit.  Psychiatric:        Behavior: Behavior normal.         Thought Content: Thought content normal.        Judgment: Judgment normal.    Assessment/Plan: 1. Acute idiopathic gout of right elbow Use colchicine and allopurinol as before.  - colchicine 0.6 MG tablet; Take 1 tablet (0.6 mg total) by mouth daily.  Dispense: 60 tablet; Refill: 4 - allopurinol (ZYLOPRIM) 100 MG tablet; Take 1 tablet (100 mg total) by mouth daily.  Dispense: 90 tablet; Refill: 2  2. Acute idiopathic gout involving toe of left foot Use med as before - colchicine 0.6 MG tablet; Take 1 tablet (0.6 mg total) by mouth daily.  Dispense: 60 tablet; Refill: 4 - allopurinol (ZYLOPRIM) 100 MG tablet; Take 1 tablet (100 mg total) by mouth daily.  Dispense: 90 tablet; Refill: 2  General Counseling: Miko verbalizes understanding of the findings of todays visit and agrees with plan of treatment. I have discussed any further diagnostic evaluation that may be needed or ordered today. We also reviewed his medications today. he has been encouraged to call the office with any questions or concerns that should arise related to todays visit.   No orders of the defined types were placed in this encounter.   Meds ordered this encounter  Medications   colchicine 0.6 MG tablet    Sig: Take 1 tablet (0.6 mg total) by mouth daily.    Dispense:  60 tablet    Refill:  4   allopurinol (ZYLOPRIM) 100 MG tablet    Sig: Take 1 tablet (100 mg total) by mouth daily.    Dispense:  90 tablet    Refill:  2    Time spent: 30 Minutes  This patient was seen by Orson Gear AGNP-C in Collaboration with Dr Lavera Guise as a part of collaborative care agreement.  Kendell Bane AGNP-C Internal Medicine

## 2019-10-19 ENCOUNTER — Encounter: Payer: Self-pay | Admitting: Adult Health

## 2019-10-19 ENCOUNTER — Telehealth: Payer: Self-pay

## 2019-10-19 NOTE — Progress Notes (Signed)
Scanned in pt short term disability

## 2019-10-19 NOTE — Telephone Encounter (Signed)
Faxed patient short term disability. Jason Raymond

## 2019-12-26 ENCOUNTER — Other Ambulatory Visit: Payer: Self-pay | Admitting: Adult Health

## 2019-12-26 DIAGNOSIS — T7840XD Allergy, unspecified, subsequent encounter: Secondary | ICD-10-CM

## 2019-12-26 NOTE — Telephone Encounter (Signed)
Pt needs an appointment for Physical  and labs

## 2020-02-23 ENCOUNTER — Encounter: Payer: BC Managed Care – PPO | Admitting: Nurse Practitioner

## 2020-02-24 ENCOUNTER — Ambulatory Visit
Admission: EM | Admit: 2020-02-24 | Discharge: 2020-02-24 | Disposition: A | Discharge status: Home / Self Care | Payer: BC Managed Care – PPO | Attending: Physician Assistant | Admitting: Physician Assistant

## 2020-02-24 ENCOUNTER — Ambulatory Visit (INDEPENDENT_AMBULATORY_CARE_PROVIDER_SITE_OTHER): Payer: BC Managed Care – PPO

## 2020-02-24 ENCOUNTER — Other Ambulatory Visit: Payer: Self-pay

## 2020-02-24 ENCOUNTER — Encounter: Payer: Self-pay | Admitting: Gynecology

## 2020-02-24 DIAGNOSIS — R509 Fever, unspecified: Secondary | ICD-10-CM | POA: Diagnosis not present

## 2020-02-24 DIAGNOSIS — J069 Acute upper respiratory infection, unspecified: Secondary | ICD-10-CM | POA: Diagnosis not present

## 2020-02-24 DIAGNOSIS — R059 Cough, unspecified: Secondary | ICD-10-CM | POA: Diagnosis not present

## 2020-02-24 DIAGNOSIS — U071 COVID-19: Secondary | ICD-10-CM | POA: Diagnosis not present

## 2020-02-24 LAB — CBC WITH DIFFERENTIAL/PLATELET
Abs Immature Granulocytes: 0.02 10*3/uL (ref 0.00–0.07)
Basophils Absolute: 0 10*3/uL (ref 0.0–0.1)
Basophils Relative: 1 %
Eosinophils Absolute: 0.1 10*3/uL (ref 0.0–0.5)
Eosinophils Relative: 1 %
HCT: 43.6 % (ref 39.0–52.0)
Hemoglobin: 14.8 g/dL (ref 13.0–17.0)
Immature Granulocytes: 1 %
Lymphocytes Relative: 23 %
Lymphs Abs: 0.9 10*3/uL (ref 0.7–4.0)
MCH: 29.1 pg (ref 26.0–34.0)
MCHC: 33.9 g/dL (ref 30.0–36.0)
MCV: 85.8 fL (ref 80.0–100.0)
Monocytes Absolute: 0.4 10*3/uL (ref 0.1–1.0)
Monocytes Relative: 11 %
Neutro Abs: 2.5 10*3/uL (ref 1.7–7.7)
Neutrophils Relative %: 63 %
Platelets: 209 10*3/uL (ref 150–400)
RBC: 5.08 MIL/uL (ref 4.22–5.81)
RDW: 12.6 % (ref 11.5–15.5)
WBC: 3.8 10*3/uL — ABNORMAL LOW (ref 4.0–10.5)
nRBC: 0 % (ref 0.0–0.2)

## 2020-02-24 LAB — GROUP A STREP BY PCR: Group A Strep by PCR: NOT DETECTED

## 2020-02-24 NOTE — ED Triage Notes (Signed)
Patient c/o fever at home last nigh of 101. Pt stated with cough and sore throat x 3 days ago,

## 2020-02-24 NOTE — ED Provider Notes (Signed)
[facility]    CSN: [id number] Arrival date & time: 02/24/20  1415      History   Chief Complaint Chief Complaint  Patient presents with  . Fever  . Cough  . Sore Throat    HPI Jason Raymond is a 44 y.o. male who present of onset of cough and ST x 3 days ago which lasted for 2 days, then felt better yesterday and today feels worse again. Had a  fever of 101 last night. Has not been sick in 10 years, so this is unusual for him. Denies body aches or HA. Had Tylenol about 1h ago. Denies loss of taste or smell. Denies GU or GI symptoms.   Past Medical History:  Diagnosis Date  . Complication of anesthesia   . GERD (gastroesophageal reflux disease)    no meds  . Gout   . Headache    MIGRAINES  . History of kidney stones   . PONV (postoperative nausea and vomiting)    naueseated AFTER 1ST SURGERY ONLY  . Sleep apnea    NO CPAP-PT STATES HE HAS LOST WEIGHT AND HE THINKS THIS HAS HELPED     Patient Active Problem List   Diagnosis Date Noted  . 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 Neoplasm of uncertain behavior of skin of forearm 10/17/2018    Past Surgical History:  Procedure Laterality Date  . DISTAL BICEPS TENDON REPAIR Right 09/01/2017   Procedure: DISTAL BICEPS TENDON REPAIR;  Surgeon: Earnestine Leys, MD;  Location: [facility] ORS;  Service: Orthopedics;  Laterality: Right;  . SHOULDER ARTHROSCOPY WITH DEBRIDEMENT AND BICEP TENDON REPAIR Right 12/08/2016   Procedure: SHOULDER ARTHROSCOPY WITH LYSIS OF ADHESIONS, AND  CAPSULOTOMY.;  Surgeon: Thornton Park, MD;  Location: [facility] ORS;  Service: Orthopedics;  Laterality: Right;  . SHOULDER ARTHROSCOPY WITH OPEN ROTATOR CUFF REPAIR Right 06/27/2015   Procedure: SHOULDER ARTHROSCOPIC labral repair WITH MINI OPEN ROTATOR CUFF REPAIR;  Surgeon: Thornton Park, MD;  Location: [facility] ORS;  Service: Orthopedics;  Laterality: Right;  [facility] emailed Rob Bagwell  Sleep Apnea YES  suppose to sleep with  CPAP Machine but does not. 7-10 day Return        Home Medications    Prior to Admission medications   Medication Sig Start Date End Date Taking? Authorizing Provider  allopurinol (ZYLOPRIM) 100 MG tablet Take 1 tablet (100 mg total) by mouth daily. 10/04/19  Yes Scarboro, Audie Clear, NP  colchicine 0.6 MG tablet Take 1 tablet (0.6 mg total) by mouth daily. 10/04/19  Yes Scarboro, Audie Clear, NP  fluticasone (FLONASE) 50 MCG/ACT nasal spray PLACE 2 SPRAYS INTO BOTH NOSTRILS IN THE MORNING AND AT BEDTIME. 12/26/19  Yes Lavera Guise, MD  levocetirizine (XYZAL) 5 MG tablet Take 5 mg by mouth every evening.   Yes [provider]    Family History Family History  Problem Relation Age of Onset  . Healthy Mother   . Healthy Father     Social History Social History   Tobacco Use  . Smoking status: Never Smoker  . Smokeless tobacco: Former Systems developer    Types: Snuff  Vaping Use  . Vaping Use: Never used  Substance Use Topics  . Alcohol use: Yes    Comment: BEER 2-4 times a month   . Drug use: No     Allergies   Shellfish allergy   Review of Systems Review of Systems  Constitutional: Positive for chills, fatigue and fever.  Negative for activity change, appetite change and diaphoresis.  HENT: Positive for congestion, postnasal drip, rhinorrhea and sore throat. Negative for ear discharge, ear pain and trouble swallowing.   Eyes: Negative for discharge.  Respiratory: Positive for cough. Negative for chest tightness, shortness of breath and wheezing.   Cardiovascular: Negative for chest pain.  Gastrointestinal: Negative for abdominal pain, diarrhea, nausea and vomiting.  Genitourinary: Negative for difficulty urinating.  Musculoskeletal: Negative for gait problem and myalgias.  Skin: Negative for rash.  Neurological: Negative for headaches.  Hematological: Negative for adenopathy.     Physical Exam Triage Vital Signs ED Triage Vitals  Enc Vitals Group     BP 02/24/20 1446  (!) 142/92     Pulse Rate 02/24/20 1446 95     Resp 02/24/20 1446 16     Temp 02/24/20 1446 98.8 F (37.1 C)     Temp src --      SpO2 02/24/20 1446 100 %     Weight 02/24/20 1444 210 lb (95.3 kg)     Height 02/24/20 1444 6\' 2"  (1.88 m)     Head Circumference --      Peak Flow --      Pain Score 02/24/20 1444 0     Pain Loc --      Pain Edu? --      Excl. in [place]? --    No data found.  Updated Vital Signs BP (!) 142/92 (BP Location: Left Arm)   Pulse 95   Temp 98.8 F (37.1 C)   Resp 16   Ht 6\' 2"  (1.88 m)   Wt 210 lb (95.3 kg)   SpO2 100%   BMI 26.96 kg/m   Visual Acuity Right Eye Distance:   Left Eye Distance:   Bilateral Distance:    Right Eye Near:   Left Eye Near:    Bilateral Near:      Physical Exam Vitals signs and nursing note reviewed.  Constitutional:      General: She is not in acute distress.    Appearance: Normal appearance. She is not ill-appearing, toxic-appearing or diaphoretic.  HENT:     Head: Normocephalic.     Right Ear: Tympanic membrane, ear canal and external ear normal.     Left Ear: Tympanic membrane, ear canal and external ear normal.     Nose: Nose normal.     Mouth/Throat:     Mouth: Mucous membranes are moist.  Eyes:     General: No scleral icterus.       Right eye: No discharge.        Left eye: No discharge.     Conjunctiva/sclera: Conjunctivae normal.  Neck:     Musculoskeletal: Neck supple. No neck rigidity.  Cardiovascular:     Rate and Rhythm: Normal rate and regular rhythm.     Heart sounds: No murmur.  Pulmonary:     Effort: Pulmonary effort is normal.     Breath sounds: Normal breath sounds.   Musculoskeletal: Normal range of motion.  Lymphadenopathy:     Cervical: No cervical adenopathy.  Skin:    General: Skin is warm and dry.     Coloration: Skin is not jaundiced.     Findings: No rash.  Neurological:     Mental Status: She is alert and oriented to person, place, and time.     Gait: Gait normal.   Psychiatric:        Mood and Affect: Mood normal.  Behavior: Behavior normal.        Thought Content: Thought content normal.        Judgment: Judgment normal.   UC Treatments / Results  Labs (all labs ordered are listed, but only abnormal results are displayed) Labs Reviewed  CBC WITH DIFFERENTIAL/PLATELET - Abnormal; Notable for the following components:      Result Value   WBC 3.8 (*)    All other components within normal limits  GROUP A STREP BY PCR  SARS CORONAVIRUS 2 (TAT 6-24 HRS)    EKG   Radiology DG Chest 2 View  Result Date: 02/24/2020 CLINICAL DATA:  Cough and fever EXAM: CHEST - 2 VIEW COMPARISON:  None. FINDINGS: The heart size and mediastinal contours are within normal limits. Both lungs are clear. The visualized skeletal structures are unremarkable. IMPRESSION: No active cardiopulmonary disease. Electronically Signed   By: Constance Holster M.D.   On: 02/24/2020 15:27    Procedures Procedures (including critical care time)  Medications Ordered in UC Medications - No data to display  Initial Impression / Assessment and Plan / UC Course  I have reviewed the triage vital signs and the nursing notes. Has viral illness. His CXR was neg.  Covid test is pending. Strep is neg and CBC shows slightly low white cell count. Supportive care advised in the mean time. See instructions.  Pertinent labs & imaging results that were available during my care of the patient were reviewed by me and considered in my medical decision making (see chart for details).   Final Clinical Impressions(s) / UC Diagnoses   Final diagnoses:  Viral URI with cough     Discharge Instructions     You have a viral respiratory illness and could be early Covid. Your white cell count is a little low, normal is 4K-10K, yours is 3.8k, therefore this is an indication of a viral illness and not bacterial The strep test and chest xray are negative Signs up for Mychart so you can see  your covid test results when they come in.  If your Covid test ends up positive you may take the following supplements to help your immune system be stronger to fight this viral infection Take Quarcetin 500 mg three times a day x 7 days with Zinc 50 mg ones a day x 7 days. The quarcetin is an antiviral and anti-inflammatory supplement which helps open the zinc channels in the cell to absorb Zinc. Zinc helps decrease the virus load in your body. Take Melatonin 6-10 mg at bed time which also helps support your immune system.  Also make sure to take Vit D 5,000 IU per day with a fatty meal and Vit C 1000 mg a day until you are completely better. Stay on Vitamin D 2,000  and C the rest of the season.  Don't lay around, keep active and walk as much as you are able to to prevent worsening of your symptoms.  Follow up with your family Dr next week.  If you get short of breath and you are able to check  your oxygen with a pulse oxygen meter, if it gets to 92% or less, you need to go to the hospital to be admitted. If you dont have one, come back here and we will assess you.      ED Prescriptions    None     PDMP not reviewed this encounter.   Shelby Mattocks, PA-C 02/24/20 1553

## 2020-02-24 NOTE — Discharge Instructions (Signed)
You have a viral respiratory illness and could be early Covid. Your white cell count is a little low, normal is 4K-10K, yours is 3.8k, therefore this is an indication of a viral illness and not bacterial The strep test and chest xray are negative Signs up for Mychart so you can see your covid test results when they come in.  If your Covid test ends up positive you may take the following supplements to help your immune system be stronger to fight this viral infection Take Quarcetin 500 mg three times a day x 7 days with Zinc 50 mg ones a day x 7 days. The quarcetin is an antiviral and anti-inflammatory supplement which helps open the zinc channels in the cell to absorb Zinc. Zinc helps decrease the virus load in your body. Take Melatonin 6-10 mg at bed time which also helps support your immune system.  Also make sure to take Vit D 5,000 IU per day with a fatty meal and Vit C 1000 mg a day until you are completely better. Stay on Vitamin D 2,000  and C the rest of the season.  Don't lay around, keep active and walk as much as you are able to to prevent worsening of your symptoms.  Follow up with your family Dr next week.  If you get short of breath and you are able to check  your oxygen with a pulse oxygen meter, if it gets to 92% or less, you need to go to the hospital to be admitted. If you dont have one, come back here and we will assess you.

## 2020-02-25 ENCOUNTER — Telehealth: Payer: Self-pay | Admitting: Emergency Medicine

## 2020-02-25 LAB — SARS CORONAVIRUS 2 (TAT 6-24 HRS): SARS Coronavirus 2: POSITIVE — AB

## 2020-02-25 NOTE — Telephone Encounter (Signed)
Received call from [facility] lab re: positive covid test on patient. Called patient and notified of positive covid test. Patient aware and voiced understanding.

## 2020-02-26 ENCOUNTER — Telehealth: Payer: Self-pay | Admitting: Physician Assistant

## 2020-02-26 ENCOUNTER — Encounter: Payer: Self-pay | Admitting: Physician Assistant

## 2020-02-26 DIAGNOSIS — Z6826 Body mass index (BMI) 26.0-26.9, adult: Secondary | ICD-10-CM | POA: Insufficient documentation

## 2020-02-26 NOTE — Telephone Encounter (Signed)
That sounds great. Thanks.

## 2020-02-26 NOTE — Telephone Encounter (Signed)
Called to discuss with patient about Covid symptoms and the use of sotrovimab, bamlanivimab/etesevimab or casirivimab/imdevimab, a monoclonal antibody infusion for those with mild to moderate Covid symptoms and at a high risk of hospitalization.  Pt is qualified for this infusion at the [place] [facility] due to; Specific high risk criteria : BMI > 25   Message left to call back our hotline [phone] and sent a mychart message.  Angelena Form PA-C  [facility]

## 2020-02-26 NOTE — Telephone Encounter (Signed)
Called to Discuss with patient about Covid symptoms and the use of the monoclonal antibody infusion for those with mild to moderate Covid symptoms and at a high risk of hospitalization.     Pt appears to qualify for this infusion due to co-morbid conditions and/or a member of an at-risk group in accordance with the FDA Emergency Use Authorization.    Unable to reach pt. Left voice mail to cal back.

## 2020-02-27 ENCOUNTER — Telehealth: Payer: Self-pay | Admitting: Physician Assistant

## 2020-02-27 NOTE — Telephone Encounter (Signed)
Called to discuss with patient about Covid symptoms and the use of sotrovimab, bamlanivimab/etesevimab or casirivimab/imdevimab, a monoclonal antibody infusion for those with mild to moderate Covid symptoms and at a high risk of hospitalization.  Pt is qualified for this infusion at the [place] [facility] center due to; Specific high risk criteria : BMI > 25   He is interested in the infusion, but is not feeling well enough to drive to [place]. I have reached out to [facility] in [place] who can treat him with Regen-Cov tomorrow at noon. They will reach out to the patient to arrange appt.   Angelena Form PA-C  [facility]

## 2020-02-28 DIAGNOSIS — R918 Other nonspecific abnormal finding of lung field: Secondary | ICD-10-CM | POA: Diagnosis not present

## 2020-02-28 DIAGNOSIS — U071 COVID-19: Secondary | ICD-10-CM | POA: Diagnosis not present

## 2020-02-28 DIAGNOSIS — J208 Acute bronchitis due to other specified organisms: Secondary | ICD-10-CM | POA: Diagnosis not present

## 2020-03-01 DIAGNOSIS — U071 COVID-19: Secondary | ICD-10-CM | POA: Diagnosis not present

## 2020-03-01 DIAGNOSIS — J1282 Pneumonia due to coronavirus disease 2019: Secondary | ICD-10-CM | POA: Diagnosis not present

## 2020-03-12 DIAGNOSIS — Z8616 Personal history of COVID-19: Secondary | ICD-10-CM | POA: Diagnosis not present

## 2020-03-12 DIAGNOSIS — R0602 Shortness of breath: Secondary | ICD-10-CM | POA: Diagnosis not present

## 2020-03-12 DIAGNOSIS — M419 Scoliosis, unspecified: Secondary | ICD-10-CM | POA: Diagnosis not present

## 2020-03-15 DIAGNOSIS — J1282 Pneumonia due to coronavirus disease 2019: Secondary | ICD-10-CM | POA: Diagnosis not present

## 2020-03-15 DIAGNOSIS — U071 COVID-19: Secondary | ICD-10-CM | POA: Diagnosis not present

## 2020-03-25 ENCOUNTER — Other Ambulatory Visit: Payer: Self-pay | Admitting: Internal Medicine

## 2020-03-25 DIAGNOSIS — T7840XD Allergy, unspecified, subsequent encounter: Secondary | ICD-10-CM

## 2020-04-08 ENCOUNTER — Ambulatory Visit: Payer: BC Managed Care – PPO | Admitting: Adult Health

## 2020-05-08 ENCOUNTER — Other Ambulatory Visit: Payer: Self-pay

## 2020-05-08 ENCOUNTER — Ambulatory Visit
Admission: EM | Admit: 2020-05-08 | Discharge: 2020-05-08 | Disposition: A | Discharge status: Home / Self Care | Payer: BC Managed Care – PPO | Attending: Family Medicine | Admitting: Family Medicine

## 2020-05-08 ENCOUNTER — Encounter: Payer: Self-pay | Admitting: Emergency Medicine

## 2020-05-08 DIAGNOSIS — J069 Acute upper respiratory infection, unspecified: Secondary | ICD-10-CM | POA: Diagnosis not present

## 2020-05-08 DIAGNOSIS — U071 COVID-19: Secondary | ICD-10-CM | POA: Insufficient documentation

## 2020-05-08 NOTE — ED Provider Notes (Signed)
[facility]    CSN: [id number] Arrival date & time: 05/08/20  1656      History   Chief Complaint Chief Complaint  Patient presents with  . Sore Throat  . Nasal Congestion    HPI Jason Raymond is a 45 y.o. male who presents with nose congestion and ST since this morning and this is how he started and felt when he had Covid last year. His sister has covid this week. Denies fever or body aches or fatigue  Past Medical History:  Diagnosis Date  . BMI 26.0-26.9,adult   . Complication of anesthesia   . GERD (gastroesophageal reflux disease)    no meds  . Gout   . Headache    MIGRAINES  . History of kidney stones   . PONV (postoperative nausea and vomiting)    naueseated AFTER 1ST SURGERY ONLY  . Sleep apnea    NO CPAP-PT STATES HE HAS LOST WEIGHT AND HE THINKS THIS HAS HELPED     Patient Active Problem List   Diagnosis Date Noted  . BMI 26.0-26.9,adult   . 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 Neoplasm of uncertain behavior of skin of forearm 10/17/2018    Past Surgical History:  Procedure Laterality Date  . DISTAL BICEPS TENDON REPAIR Right 09/01/2017   Procedure: DISTAL BICEPS TENDON REPAIR;  Surgeon: Deeann Saint, MD;  Location: [facility];  Service: Orthopedics;  Laterality: Right;  . SHOULDER ARTHROSCOPY WITH DEBRIDEMENT AND BICEP TENDON REPAIR Right 12/08/2016   Procedure: SHOULDER ARTHROSCOPY WITH LYSIS OF ADHESIONS, AND  CAPSULOTOMY.;  Surgeon: Juanell Fairly, MD;  Location: [facility];  Service: Orthopedics;  Laterality: Right;  . SHOULDER ARTHROSCOPY WITH OPEN ROTATOR CUFF REPAIR Right 06/27/2015   Procedure: SHOULDER ARTHROSCOPIC labral repair WITH MINI OPEN ROTATOR CUFF REPAIR;  Surgeon: Juanell Fairly, MD;  Location: [facility];  Service: Orthopedics;  Laterality: Right;  Arthrocare emailed Rob Bagwell  Sleep Apnea YES  suppose to sleep with CPAP Machine but does not. 7-10 day Return      Home Medications    Prior to  Admission medications   Medication Sig Start Date End Date Taking? Authorizing Provider  allopurinol (ZYLOPRIM) 100 MG tablet Take 1 tablet (100 mg total) by mouth daily. 10/04/19  Yes Scarboro, Coralee North, NP  colchicine 0.6 MG tablet Take 1 tablet (0.6 mg total) by mouth daily. 10/04/19  Yes Scarboro, Coralee North, NP  fluticasone (FLONASE) 50 MCG/ACT nasal spray PLACE 2 SPRAYS INTO BOTH NOSTRILS IN THE MORNING AND AT BEDTIME. *NEEDS APPT FOR REFILLS 03/25/20  Yes Lyndon Code, MD  levocetirizine (XYZAL) 5 MG tablet Take 5 mg by mouth every evening.   Yes [provider]    Family History Family History  Problem Relation Age of Onset  . Healthy Mother   . Healthy Father     Social History Social History   Tobacco Use  . Smoking status: Never Smoker  . Smokeless tobacco: Former Neurosurgeon    Types: Snuff  Vaping Use  . Vaping Use: Never used  Substance Use Topics  . Alcohol use: Yes    Comment: BEER 2-4 times a month   . Drug use: No     Allergies   Shellfish allergy   Review of Systems Review of Systems  Constitutional: Negative for activity change, appetite change, chills, diaphoresis, fatigue and fever.  HENT: Positive for congestion and sore throat. Negative for ear discharge and ear pain.  Eyes: Negative for discharge.  Respiratory: Positive for cough. Negative for chest tightness and shortness of breath.   Gastrointestinal: Negative for diarrhea, nausea and vomiting.  Musculoskeletal: Negative for myalgias.  Skin: Negative for rash.  Neurological: Negative for headaches.     Physical Exam Triage Vital Signs ED Triage Vitals  Enc Vitals Group     BP 05/08/20 1753 (!) 152/107     Pulse Rate 05/08/20 1753 (!) 104     Resp 05/08/20 1753 18     Temp 05/08/20 1753 99.2 F (37.3 C)     Temp Source 05/08/20 1753 Oral     SpO2 05/08/20 1753 100 %     Weight 05/08/20 1751 210 lb (95.3 kg)     Height 05/08/20 1751 6\' 2"  (1.88 m)     Head Circumference --      Peak  Flow --      Pain Score 05/08/20 1751 2     Pain Loc --      Pain Edu? --      Excl. in [place]? --    No data found.  Updated Vital Signs BP (!) 152/107 (BP Location: Right Arm)   Pulse (!) 104   Temp 99.2 F (37.3 C) (Oral)   Resp 18   Ht 6\' 2"  (1.88 m)   Wt 210 lb (95.3 kg)   SpO2 100%   BMI 26.96 kg/m   Repeated BP 110/70 Visual Acuity Right Eye Distance:   Left Eye Distance:   Bilateral Distance:    Right Eye Near:   Left Eye Near:    Bilateral Near:     Physical Exam Physical Exam Vitals signs and nursing note reviewed.  Constitutional:      General: he is not in acute distress.    Appearance: Normal appearance. he is not ill-appearing, toxic-appearing or diaphoretic.  HENT:     Head: Normocephalic.     Right Ear: Tympanic membrane, ear canal and external ear normal.     Left Ear: Tympanic membrane, ear canal and external ear normal.     Nose: Nose normal.     Mouth/Throat:     Mouth: Mucous membranes are moist.  Eyes:     General: No scleral icterus.       Right eye: No discharge.        Left eye: No discharge.     Conjunctiva/sclera: Conjunctivae normal.  Neck:     Musculoskeletal: Neck supple. No neck rigidity.  Cardiovascular:     Rate and Rhythm: Normal rate and regular rhythm.     Heart sounds: No murmur.  Pulmonary:     Effort: Pulmonary effort is normal.     Breath sounds: Normal breath sounds.   Musculoskeletal: Normal range of motion.  Lymphadenopathy:     Cervical: No cervical adenopathy.  Skin:    General: Skin is warm and dry.     Coloration: Skin is not jaundiced.     Findings: No rash.  Neurological:     Mental Status:he is alert and oriented to person, place, and time.     Gait: Gait normal.  Psychiatric:        Mood and Affect: Mood normal.        Behavior: Behavior normal.        Thought Content: Thought content normal.        Judgment: Judgment normal.     UC Treatments / Results  Labs (all labs ordered are listed, but  only abnormal  results are displayed) Labs Reviewed  SARS CORONAVIRUS 2 (TAT 6-24 HRS)    EKG   Radiology No results found.  Procedures Procedures (including critical care time)  Medications Ordered in UC Medications - No data to display  Initial Impression / Assessment and Plan / UC Course  I have reviewed the triage vital signs and the nursing notes. Early URI Covid is pending. See instructions.  Final Clinical Impressions(s) / UC Diagnoses   Final diagnoses:  Upper respiratory tract infection, unspecified type     Discharge Instructions     If your Covid test ends up positive you may take the following supplements to help your immune system be stronger to fight this viral infection Take Quarcetin 500 mg three times a day x 7 days with Zinc 50 mg ones a day x 7 days. The quarcetin is an antiviral and anti-inflammatory supplement which helps open the zinc channels in the cell to absorb Zinc. Zinc helps decrease the virus load in your body. Take Melatonin 6-10 mg at bed time which also helps support your immune system.  Also make sure to take Vit D 5,000 IU per day with a fatty meal and Vit C 5000 mg a day until you are completely better. To prevent viral illnesses your vitamin D should be between 60-80. Stay on Vitamin D 2,000  and C  1000 mg the rest of the season.  Don't lay around, keep active and walk as much as you are able to to prevent worsening of your symptoms.  Follow up with your family Dr next week.  If you get short of breath and you are able to check  your oxygen with a pulse oxygen meter, if it gets to 92% or less, you need to go to the hospital to be admitted. If you dont have one, come back here and we will assess you.      ED Prescriptions    None     PDMP not reviewed this encounter.   Shelby Mattocks, [place] 05/08/20 1814

## 2020-05-08 NOTE — ED Triage Notes (Signed)
Patient states he woke up with a sore throat and nasal congestion this morning. He states this is how he felt when he had COVID October 2021.

## 2020-05-08 NOTE — Discharge Instructions (Addendum)
If your Covid test ends up positive you may take the following supplements to help your immune system be stronger to fight this viral infection Take Quarcetin 500 mg three times a day x 7 days with Zinc 50 mg ones a day x 7 days. The quarcetin is an antiviral and anti-inflammatory supplement which helps open the zinc channels in the cell to absorb Zinc. Zinc helps decrease the virus load in your body. Take Melatonin 6-10 mg at bed time which also helps support your immune system.  Also make sure to take Vit D 5,000 IU per day with a fatty meal and Vit C 5000 mg a day until you are completely better. To prevent viral illnesses your vitamin D should be between 60-80. Stay on Vitamin D 2,000  and C  1000 mg the rest of the season.  Don't lay around, keep active and walk as much as you are able to to prevent worsening of your symptoms.  Follow up with your family Dr next week.  If you get short of breath and you are able to check  your oxygen with a pulse oxygen meter, if it gets to 92% or less, you need to go to the hospital to be admitted. If you dont have one, come back here and we will assess you.

## 2020-05-09 LAB — SARS CORONAVIRUS 2 (TAT 6-24 HRS): SARS Coronavirus 2: POSITIVE — AB

## 2020-06-17 ENCOUNTER — Other Ambulatory Visit: Payer: Self-pay | Admitting: Internal Medicine

## 2020-06-17 DIAGNOSIS — T7840XD Allergy, unspecified, subsequent encounter: Secondary | ICD-10-CM

## 2020-07-01 ENCOUNTER — Other Ambulatory Visit: Payer: Self-pay | Admitting: Adult Health

## 2020-07-01 DIAGNOSIS — M10072 Idiopathic gout, left ankle and foot: Secondary | ICD-10-CM

## 2020-07-01 DIAGNOSIS — M10021 Idiopathic gout, right elbow: Secondary | ICD-10-CM

## 2020-07-15 ENCOUNTER — Other Ambulatory Visit: Payer: Self-pay

## 2020-07-15 ENCOUNTER — Other Ambulatory Visit: Payer: Self-pay | Admitting: Adult Health

## 2020-07-15 DIAGNOSIS — M10072 Idiopathic gout, left ankle and foot: Secondary | ICD-10-CM

## 2020-07-15 DIAGNOSIS — M10021 Idiopathic gout, right elbow: Secondary | ICD-10-CM

## 2020-07-15 MED ORDER — ALLOPURINOL 100 MG PO TABS
100.0000 mg | ORAL_TABLET | Freq: Every day | ORAL | 0 refills | Status: DC
Start: 2020-07-15 — End: 2020-07-26

## 2020-07-26 ENCOUNTER — Ambulatory Visit: Payer: BC Managed Care – PPO | Admitting: Physician Assistant

## 2020-07-26 ENCOUNTER — Encounter: Payer: Self-pay | Admitting: Physician Assistant

## 2020-07-26 ENCOUNTER — Other Ambulatory Visit: Payer: Self-pay

## 2020-07-26 DIAGNOSIS — M79606 Pain in leg, unspecified: Secondary | ICD-10-CM | POA: Diagnosis not present

## 2020-07-26 DIAGNOSIS — Z3009 Encounter for other general counseling and advice on contraception: Secondary | ICD-10-CM | POA: Diagnosis not present

## 2020-07-26 DIAGNOSIS — M10072 Idiopathic gout, left ankle and foot: Secondary | ICD-10-CM | POA: Diagnosis not present

## 2020-07-26 DIAGNOSIS — R5383 Other fatigue: Secondary | ICD-10-CM

## 2020-07-26 DIAGNOSIS — M10021 Idiopathic gout, right elbow: Secondary | ICD-10-CM | POA: Diagnosis not present

## 2020-07-26 MED ORDER — COLCHICINE 0.6 MG PO TABS: 0.6000 mg | ORAL_TABLET | Freq: Every day | ORAL | 4 refills | Status: DC

## 2020-07-26 MED ORDER — ALLOPURINOL 100 MG PO TABS: 100.0000 mg | ORAL_TABLET | Freq: Every day | ORAL | 0 refills | Status: DC

## 2020-07-26 NOTE — Progress Notes (Signed)
[facility] [place], [place] [address]  Internal MEDICINE  Office Visit Note  Patient Name: Jason Raymond  [id number]  [id number]  Date of Service: 07/26/2020  Chief Complaint  Patient presents with  . Follow-up    Refill request, when pt sits or sleeps on side he gets pain in back of upper thighs, wants vasectomy  . Gastroesophageal Reflux  . Sleep Apnea    HPI Pt is here for follow up for med refills. -Pt previously needed note for work for about 3 times per year for about 5 days each in case of gout flare and requesting refills today. He has not had a flare in awhile. -Hx of OSA--lost 30-40lbs and stopped CPAP several years ago bc didn't think it was a problem anymore. Snores some still but no gasping or daytime sleepiness anymore. States his wife also noticed a difference after weight loss and doesn't complain anymore. Pt never had repeat sleep study and is not interested at this time. -For the past few years if he sits for more than 10 minutes gets some pain in pack of thighs. This has gotten to the point where it is irritating, but not worsening but impacts sleep sometimes. Doesn't feel muscular and does not impact movement or feel it when he is up and moving. He is very active at [employer].  -BP initially elevated, but improved on recheck to 130/90 -Denies any reflux symptoms  Current Medication: Outpatient Encounter Medications as of 07/26/2020  Medication Sig  . fluticasone (FLONASE) 50 MCG/ACT nasal spray PLACE 2 SPRAYS INTO BOTH NOSTRILS IN THE MORNING AND AT BEDTIME. *NEEDS APPT FOR REFILLS  . levocetirizine (XYZAL) 5 MG tablet Take 5 mg by mouth every evening.  . [DISCONTINUED] allopurinol (ZYLOPRIM) 100 MG tablet Take 1 tablet (100 mg total) by mouth daily.  . [DISCONTINUED] colchicine 0.6 MG tablet Take 1 tablet (0.6 mg total) by mouth daily.  Marland Kitchen allopurinol (ZYLOPRIM) 100 MG tablet Take 1 tablet (100 mg total) by mouth daily.  .  colchicine 0.6 MG tablet Take 1 tablet (0.6 mg total) by mouth daily.   No facility-administered encounter medications on file as of 07/26/2020.    Surgical History: Past Surgical History:  Procedure Laterality Date  . DISTAL BICEPS TENDON REPAIR Right 09/01/2017   Procedure: DISTAL BICEPS TENDON REPAIR;  Surgeon: Earnestine Leys, MD;  Location: [facility];  Service: Orthopedics;  Laterality: Right;  . SHOULDER ARTHROSCOPY WITH DEBRIDEMENT AND BICEP TENDON REPAIR Right 12/08/2016   Procedure: SHOULDER ARTHROSCOPY WITH LYSIS OF ADHESIONS, AND  CAPSULOTOMY.;  Surgeon: Thornton Park, MD;  Location: [facility];  Service: Orthopedics;  Laterality: Right;  . SHOULDER ARTHROSCOPY WITH OPEN ROTATOR CUFF REPAIR Right 06/27/2015   Procedure: SHOULDER ARTHROSCOPIC labral repair WITH MINI OPEN ROTATOR CUFF REPAIR;  Surgeon: Thornton Park, MD;  Location: [facility];  Service: Orthopedics;  Laterality: Right;  Arthrocare emailed Rob Bagwell  Sleep Apnea YES  suppose to sleep with CPAP Machine but does not. 7-10 day Return     Medical History: Past Medical History:  Diagnosis Date  . BMI 26.0-26.9,adult   . Complication of anesthesia   . GERD (gastroesophageal reflux disease)    no meds  . Gout   . Headache    MIGRAINES  . History of kidney stones   . PONV (postoperative nausea and vomiting)    naueseated AFTER 1ST SURGERY ONLY  . Sleep apnea    NO CPAP-PT STATES HE HAS LOST WEIGHT AND  HE THINKS THIS HAS HELPED     Family History: Family History  Problem Relation Age of Onset  . Healthy Mother   . Healthy Father     Social History   Socioeconomic History  . Marital status: Married    Spouse name: Not on file  . Number of children: Not on file  . Years of education: Not on file  . Highest education level: Not on file  Occupational History  . Not on file  Tobacco Use  . Smoking status: Never Smoker  . Smokeless tobacco: Former Systems developer    Types: Snuff  Vaping Use  . Vaping Use: Never used   Substance and Sexual Activity  . Alcohol use: Yes    Comment: BEER 2-4 times a month   . Drug use: No  . Sexual activity: Not on file  Other Topics Concern  . Not on file  Social History Narrative  . Not on file   Social Determinants of Health   Financial Resource Strain: Not on file  Food Insecurity: Not on file  Transportation Needs: Not on file  Physical Activity: Not on file  Stress: Not on file  Social Connections: Not on file  Intimate Partner Violence: Not on file      Review of Systems  Constitutional: Negative for chills, fatigue and unexpected weight change.  HENT: Negative for congestion, postnasal drip, rhinorrhea, sneezing and sore throat.   Eyes: Negative for redness.  Respiratory: Negative for cough, chest tightness and shortness of breath.   Cardiovascular: Negative for chest pain and palpitations.  Gastrointestinal: Negative for abdominal pain, constipation, diarrhea, nausea and vomiting.  Genitourinary: Negative for dysuria and frequency.  Musculoskeletal: Positive for arthralgias. Negative for back pain, joint swelling and neck pain.  Skin: Negative for rash.  Neurological: Negative.  Negative for tremors and numbness.  Hematological: Negative for adenopathy. Does not bruise/bleed easily.  Psychiatric/Behavioral: Negative for behavioral problems (Depression), sleep disturbance and suicidal ideas. The patient is not nervous/anxious.     Vital Signs: BP (!) 148/100   Pulse 88   Temp 97.7 F (36.5 C)   Resp 16   Ht 6\' 2"  (1.88 m)   Wt 219 lb 6.4 oz (99.5 kg)   SpO2 98%   BMI 28.17 kg/m    Physical Exam Vitals and nursing note reviewed.  Constitutional:      General: He is not in acute distress.    Appearance: He is well-developed. He is not diaphoretic.  HENT:     Head: Normocephalic and atraumatic.     Mouth/Throat:     Pharynx: No oropharyngeal exudate.  Eyes:     Pupils: Pupils are equal, round, and reactive to light.  Neck:      Thyroid: No thyromegaly.     Vascular: No JVD.     Trachea: No tracheal deviation.  Cardiovascular:     Rate and Rhythm: Normal rate and regular rhythm.     Heart sounds: Normal heart sounds. No murmur heard. No friction rub. No gallop.   Pulmonary:     Effort: Pulmonary effort is normal. No respiratory distress.     Breath sounds: No wheezing or rales.  Chest:     Chest wall: No tenderness.  Abdominal:     General: Bowel sounds are normal.     Palpations: Abdomen is soft.  Musculoskeletal:        General: Normal range of motion.     Cervical back: Normal range of motion and neck supple.  Lymphadenopathy:     Cervical: No cervical adenopathy.  Skin:    General: Skin is warm and dry.  Neurological:     Mental Status: He is alert and oriented to person, place, and time.     Cranial Nerves: No cranial nerve deficit.  Psychiatric:        Behavior: Behavior normal.        Thought Content: Thought content normal.        Judgment: Judgment normal.        Assessment/Plan: 1. Acute idiopathic gout of right elbow No recent flares, will continue allopurinol for prevention and take colchicine only as needed for an acute flare - allopurinol (ZYLOPRIM) 100 MG tablet; Take 1 tablet (100 mg total) by mouth daily.  Dispense: 30 tablet; Refill: 0 - colchicine 0.6 MG tablet; Take 1 tablet (0.6 mg total) by mouth daily.  Dispense: 60 tablet; Refill: 4  2. Acute idiopathic gout involving toe of left foot No recent flares, will continue allopurinol for prevention and take colchicine only as needed for an acute flare - allopurinol (ZYLOPRIM) 100 MG tablet; Take 1 tablet (100 mg total) by mouth daily.  Dispense: 30 tablet; Refill: 0 - colchicine 0.6 MG tablet; Take 1 tablet (0.6 mg total) by mouth daily.  Dispense: 60 tablet; Refill: 4  3. Screening and evaluation for vasectomy Pt would like referral for vasectomy - Ambulatory referral to Urology  4. Lower extremity pain, posterior,  unspecified laterality Pain along back of thighs only when sitting or lying on them. Does not impact movement and not noticeable when up and moving. No low back pain or radiation. Does not feel muscular. Will monitor if worsening and check labs.  5. Other fatigue - CBC With Differential - Comprehensive metabolic panel - TSH + free T4 - Lipid panel - B12 and Folate Panel   General Counseling: Adreyan verbalizes understanding of the findings of todays visit and agrees with plan of treatment. I have discussed any further diagnostic evaluation that may be needed or ordered today. We also reviewed his medications today. he has been encouraged to call the office with any questions or concerns that should arise related to todays visit.    Orders Placed This Encounter  Procedures  . CBC With Differential  . Comprehensive metabolic panel  . TSH + free T4  . Lipid panel  . B12 and Folate Panel  . Ambulatory referral to Urology    Meds ordered this encounter  Medications  . allopurinol (ZYLOPRIM) 100 MG tablet    Sig: Take 1 tablet (100 mg total) by mouth daily.    Dispense:  30 tablet    Refill:  0  . colchicine 0.6 MG tablet    Sig: Take 1 tablet (0.6 mg total) by mouth daily.    Dispense:  60 tablet    Refill:  4    This patient was seen by Drema Dallas, PA-C in collaboration with Dr. Clayborn Bigness as a part of collaborative care agreement.   Total time spent:30 Minutes Time spent includes review of chart, medications, test results, and follow up plan with the patient.      Dr Lavera Guise Internal medicine

## 2020-08-13 ENCOUNTER — Ambulatory Visit: Payer: Self-pay | Admitting: Urology

## 2020-08-15 ENCOUNTER — Other Ambulatory Visit: Payer: Self-pay

## 2020-08-15 ENCOUNTER — Encounter: Payer: Self-pay | Admitting: Urology

## 2020-08-15 ENCOUNTER — Ambulatory Visit: Payer: BC Managed Care – PPO | Admitting: Urology

## 2020-08-15 VITALS — BP 162/91 | HR 102 | Ht 74.0 in | Wt 215.0 lb

## 2020-08-15 DIAGNOSIS — Z3009 Encounter for other general counseling and advice on contraception: Secondary | ICD-10-CM

## 2020-08-15 MED ORDER — DIAZEPAM 5 MG PO TABS: 5.0000 mg | ORAL_TABLET | Freq: Once | ORAL | 0 refills | Status: DC | PRN

## 2020-08-15 NOTE — Progress Notes (Signed)
08/15/20 2:23 PM   Jason Raymond December 16, 1975 [id number]   CC: Discuss vasectomy  HPI: Healthy 45 year old male who desires vasectomy for permanent sterilization.  He has 3 daughters.  He denies any family history of prostate cancer.  He denies any urinary symptoms or gross hematuria.   PMH: Past Medical History:  Diagnosis Date  . BMI 26.0-26.9,adult   . Complication of anesthesia   . GERD (gastroesophageal reflux disease)    no meds  . Gout   . Headache    MIGRAINES  . History of kidney stones   . PONV (postoperative nausea and vomiting)    naueseated AFTER 1ST SURGERY ONLY  . Sleep apnea    NO CPAP-PT STATES HE HAS LOST WEIGHT AND HE THINKS THIS HAS HELPED     Surgical History: Past Surgical History:  Procedure Laterality Date  . DISTAL BICEPS TENDON REPAIR Right 09/01/2017   Procedure: DISTAL BICEPS TENDON REPAIR;  Surgeon: Earnestine Leys, MD;  Location: [facility] ORS;  Service: Orthopedics;  Laterality: Right;  . SHOULDER ARTHROSCOPY WITH DEBRIDEMENT AND BICEP TENDON REPAIR Right 12/08/2016   Procedure: SHOULDER ARTHROSCOPY WITH LYSIS OF ADHESIONS, AND  CAPSULOTOMY.;  Surgeon: Thornton Park, MD;  Location: [facility] OR[facility];  Service: Orthopedics;  Laterality: Right;  . SHOULDER ARTHROSCOPY WITH OPEN ROTATOR CUFF REPAIR Right 06/27/2015   Procedure: SHOULDER ARTHROSCOPIC labral repair WITH MINI OPEN ROTATOR CUFF REPAIR;  Surgeon: Thornton Park, MD;  Location: [facility] OR[facility];  Service: Orthopedics;  Laterality: Right;  [facility] emailed Rob Bagwell  Sleep Apnea YES  suppose to sleep with CPAP Machine but does not. 7-10 day Return     Family History: Family History  Problem Relation Age of Onset  . Healthy Mother   . Healthy Father     Social History:  reports that he has never smoked. He has quit using smokeless tobacco.  His smokeless tobacco use included snuff. He reports current alcohol use. He reports that he does not use drugs.  Physical Exam: BP (!) 162/91   Pulse (!) 102    Ht 6\' 2"  (1.88 m)   Wt 215 lb (97.5 kg)   BMI 27.60 kg/m    Constitutional:  Alert and oriented, No acute distress. Cardiovascular: No clubbing, cyanosis, or edema. Respiratory: Normal respiratory effort, no increased work of breathing. GI: Abdomen is soft, nontender, nondistended, no abdominal masses GU: Circumcised phallus with patent meatus, no testicular lesions, vas deferens palpable bilaterally  Assessment & Plan:   Healthy 45 year old male interested in vasectomy for permanent sterilization.  We discussed the risks and benefits of vasectomy at length.  Vasectomy is intended to be a permanent form of contraception, and does not produce immediate sterility.  Following vasectomy another form of contraception is required until vas occlusion is confirmed by a post-vasectomy semen analysis obtained 2-3 months after the procedure.  Even after vas occlusion is confirmed, vasectomy is not 100% reliable in preventing pregnancy, and the failure rate is approximately 04/1998.  Repeat vasectomy is required in less than 1% of patients.  He should refrain from ejaculation for 1 week after vasectomy.  Options for fertility after vasectomy include vasectomy reversal, and sperm retrieval with in vitro fertilization or ICSI.  These options are not always successful and may be expensive.  Finally, there are other permanent and non-permanent alternatives to vasectomy available. There is no risk of erectile dysfunction, and the volume of semen will be similar to prior, as the majority of the ejaculate is from the prostate and seminal  vesicles.   The procedure takes ~20 minutes.  We recommend patients take 5-10 mg of Valium 30 minutes prior, and he will need a driver post-procedure.  Local anesthetic is injected into the scrotal skin and a small segment of the vas deferens is removed, and the ends occluded. The complication rate is approximately 1-2%, and includes bleeding, infection, and development of chronic  scrotal pain.  PLAN: Pending insurance approval, will schedule for vasectomy at his [place], [place] 08/15/2020  [facility] [address] [place], [place] [address] [phone]

## 2020-08-15 NOTE — Patient Instructions (Signed)

## 2020-08-29 ENCOUNTER — Telehealth: Payer: Self-pay

## 2020-08-29 NOTE — Telephone Encounter (Signed)
Received disability paperwork for Gout, after discussing with provider they are unable to complete the disability paperwork for Gout. Contacted patient and advised when a flare up arises to call office and be seen and we can provide him with a work note. Loma Sousa

## 2020-09-29 ENCOUNTER — Other Ambulatory Visit: Payer: Self-pay | Admitting: Physician Assistant

## 2020-09-29 DIAGNOSIS — M10021 Idiopathic gout, right elbow: Secondary | ICD-10-CM

## 2020-09-29 DIAGNOSIS — M10072 Idiopathic gout, left ankle and foot: Secondary | ICD-10-CM

## 2020-09-30 ENCOUNTER — Other Ambulatory Visit: Payer: Self-pay | Admitting: Adult Health

## 2020-09-30 DIAGNOSIS — M10072 Idiopathic gout, left ankle and foot: Secondary | ICD-10-CM

## 2020-09-30 DIAGNOSIS — M10021 Idiopathic gout, right elbow: Secondary | ICD-10-CM

## 2020-10-02 ENCOUNTER — Other Ambulatory Visit: Payer: Self-pay

## 2020-10-07 DIAGNOSIS — W19XXXA Unspecified fall, initial encounter: Secondary | ICD-10-CM | POA: Diagnosis not present

## 2020-10-07 DIAGNOSIS — M25522 Pain in left elbow: Secondary | ICD-10-CM | POA: Diagnosis not present

## 2020-10-07 DIAGNOSIS — S52302A Unspecified fracture of shaft of left radius, initial encounter for closed fracture: Secondary | ICD-10-CM | POA: Diagnosis not present

## 2020-10-07 DIAGNOSIS — M25422 Effusion, left elbow: Secondary | ICD-10-CM | POA: Diagnosis not present

## 2020-10-07 DIAGNOSIS — M109 Gout, unspecified: Secondary | ICD-10-CM | POA: Diagnosis not present

## 2020-10-07 DIAGNOSIS — S52125A Nondisplaced fracture of head of left radius, initial encounter for closed fracture: Secondary | ICD-10-CM | POA: Diagnosis not present

## 2020-10-07 DIAGNOSIS — S59902A Unspecified injury of left elbow, initial encounter: Secondary | ICD-10-CM | POA: Diagnosis not present

## 2020-10-07 DIAGNOSIS — S52135A Nondisplaced fracture of neck of left radius, initial encounter for closed fracture: Secondary | ICD-10-CM | POA: Diagnosis not present

## 2020-10-22 DIAGNOSIS — M25422 Effusion, left elbow: Secondary | ICD-10-CM | POA: Diagnosis not present

## 2020-10-22 DIAGNOSIS — W010XXA Fall on same level from slipping, tripping and stumbling without subsequent striking against object, initial encounter: Secondary | ICD-10-CM | POA: Diagnosis not present

## 2020-10-22 DIAGNOSIS — M109 Gout, unspecified: Secondary | ICD-10-CM | POA: Diagnosis not present

## 2020-10-22 DIAGNOSIS — S59902A Unspecified injury of left elbow, initial encounter: Secondary | ICD-10-CM | POA: Diagnosis not present

## 2020-10-22 DIAGNOSIS — S52302A Unspecified fracture of shaft of left radius, initial encounter for closed fracture: Secondary | ICD-10-CM | POA: Diagnosis not present

## 2020-10-22 DIAGNOSIS — S52125A Nondisplaced fracture of head of left radius, initial encounter for closed fracture: Secondary | ICD-10-CM | POA: Diagnosis not present

## 2020-11-01 DIAGNOSIS — M25522 Pain in left elbow: Secondary | ICD-10-CM | POA: Diagnosis not present

## 2020-11-04 DIAGNOSIS — M25522 Pain in left elbow: Secondary | ICD-10-CM | POA: Diagnosis not present

## 2020-11-06 ENCOUNTER — Other Ambulatory Visit: Payer: Self-pay | Admitting: Internal Medicine

## 2020-11-06 DIAGNOSIS — M25522 Pain in left elbow: Secondary | ICD-10-CM | POA: Diagnosis not present

## 2020-11-06 DIAGNOSIS — M10021 Idiopathic gout, right elbow: Secondary | ICD-10-CM

## 2020-11-06 DIAGNOSIS — M10072 Idiopathic gout, left ankle and foot: Secondary | ICD-10-CM

## 2020-11-13 DIAGNOSIS — M25522 Pain in left elbow: Secondary | ICD-10-CM | POA: Diagnosis not present

## 2020-11-15 DIAGNOSIS — M25522 Pain in left elbow: Secondary | ICD-10-CM | POA: Diagnosis not present

## 2020-11-19 DIAGNOSIS — S52132D Displaced fracture of neck of left radius, subsequent encounter for closed fracture with routine healing: Secondary | ICD-10-CM | POA: Diagnosis not present

## 2020-11-19 DIAGNOSIS — S59902D Unspecified injury of left elbow, subsequent encounter: Secondary | ICD-10-CM | POA: Diagnosis not present

## 2020-11-19 DIAGNOSIS — X58XXXD Exposure to other specified factors, subsequent encounter: Secondary | ICD-10-CM | POA: Diagnosis not present

## 2020-11-19 DIAGNOSIS — M25522 Pain in left elbow: Secondary | ICD-10-CM | POA: Diagnosis not present

## 2020-11-21 DIAGNOSIS — M25522 Pain in left elbow: Secondary | ICD-10-CM | POA: Diagnosis not present

## 2020-12-03 DIAGNOSIS — M25522 Pain in left elbow: Secondary | ICD-10-CM | POA: Diagnosis not present

## 2020-12-05 ENCOUNTER — Encounter: Payer: Self-pay | Admitting: Physician Assistant

## 2020-12-05 ENCOUNTER — Encounter: Payer: Self-pay | Admitting: Urology

## 2020-12-05 DIAGNOSIS — M25522 Pain in left elbow: Secondary | ICD-10-CM | POA: Diagnosis not present

## 2020-12-17 DIAGNOSIS — M25522 Pain in left elbow: Secondary | ICD-10-CM | POA: Diagnosis not present

## 2020-12-31 DIAGNOSIS — S59902D Unspecified injury of left elbow, subsequent encounter: Secondary | ICD-10-CM | POA: Diagnosis not present

## 2021-03-05 DIAGNOSIS — M542 Cervicalgia: Secondary | ICD-10-CM | POA: Diagnosis not present

## 2021-03-07 ENCOUNTER — Other Ambulatory Visit: Payer: Self-pay

## 2021-03-07 ENCOUNTER — Encounter: Payer: Self-pay | Admitting: Nurse Practitioner

## 2021-03-07 ENCOUNTER — Ambulatory Visit: Payer: BC Managed Care – PPO | Admitting: Nurse Practitioner

## 2021-03-07 VITALS — BP 135/80 | HR 82 | Temp 98.8°F | Resp 16 | Ht 74.0 in | Wt 216.6 lb

## 2021-03-07 DIAGNOSIS — R002 Palpitations: Secondary | ICD-10-CM

## 2021-03-07 DIAGNOSIS — G44229 Chronic tension-type headache, not intractable: Secondary | ICD-10-CM

## 2021-03-07 MED ORDER — AMITRIPTYLINE HCL 25 MG PO TABS: 25.0000 mg | ORAL_TABLET | Freq: Every day | ORAL | 2 refills | Status: DC

## 2021-03-07 MED ORDER — BUTALBITAL-APAP-CAFFEINE 50-325-40 MG PO TABS: 1.0000 | ORAL_TABLET | Freq: Four times a day (QID) | ORAL | 0 refills | Status: DC | PRN

## 2021-03-07 NOTE — Progress Notes (Addendum)
[facility] [place], [place] [address]  Internal MEDICINE  Office Visit Note  Patient Name: Jason Raymond  [id number]  [id number]  Date of Service: 03/07/2021  Chief Complaint  Patient presents with   Acute Visit   Headache    X 2 weeks, feels like heart is racing in the morning, makes pt feel nauseous, pt feels muscles in upper back and neck get tight around noon and then by 1 or 2 he has a headache      HPI Jason Raymond presents for an acute sick visit for headache X 2 weeks, feels like heart is racing in the morning, makes pt feel nauseous, pt feels muscles in upper back and neck get tight around noon and then by 1 or 2 he has a headache. He reports that he has been getting tension-type headaches for years. Ibiprofen, tylenol, excedrine do not help. He has tried imitrex before but was unsure if it helped or not. He has never been on a preventive medication for tension headaches that he knows of. He has been on gabapentin before but unsure if it was prescribed for headaches. He has no record of other medications being prescribed for tension headaches.    Current Medication:  Outpatient Encounter Medications as of 03/07/2021  Medication Sig   allopurinol (ZYLOPRIM) 100 MG tablet Take 1 tablet (100 mg total) by mouth daily   amitriptyline (ELAVIL) 25 MG tablet Take 1 tablet (25 mg total) by mouth at bedtime.   butalbital-acetaminophen-caffeine (FIORICET) 50-325-40 MG tablet Take 1 tablet by mouth every 6 (six) hours as needed for headache or migraine.   colchicine 0.6 MG tablet Take 1 tablet (0.6 mg total) by mouth daily.   diazepam (VALIUM) 5 MG tablet Take 1 tablet (5 mg total) by mouth once as needed for up to 1 dose (take 30 minutes prior to vasectomy).   fluticasone (FLONASE) 50 MCG/ACT nasal spray PLACE 2 SPRAYS INTO BOTH NOSTRILS IN THE MORNING AND AT BEDTIME. *NEEDS APPT FOR REFILLS   levocetirizine (XYZAL) 5 MG tablet Take 5 mg by mouth every evening.   No  facility-administered encounter medications on file as of 03/07/2021.      Medical History: Past Medical History:  Diagnosis Date   BMI 09.3-23.5,TDDUK    Complication of anesthesia    GERD (gastroesophageal reflux disease)    no meds   Gout    Headache    MIGRAINES   History of kidney stones    PONV (postoperative nausea and vomiting)    naueseated AFTER 1ST SURGERY ONLY   Sleep apnea    NO CPAP-PT STATES HE HAS LOST WEIGHT AND HE THINKS THIS HAS HELPED      Vital Signs: BP 135/80 Comment: 147/103  Pulse 82   Temp 98.8 F (37.1 C)   Resp 16   Ht 6\' 2"  (1.88 m)   Wt 216 lb 9.6 oz (98.2 kg)   SpO2 98%   BMI 27.81 kg/m    Review of Systems  Constitutional:  Negative for appetite change, chills, fatigue and fever.  HENT: Negative.  Negative for congestion.   Eyes:  Negative for pain.  Respiratory: Negative.  Negative for cough, chest tightness, shortness of breath and wheezing.   Cardiovascular:  Positive for palpitations (feels like heart is racing prior to headache coming on.). Negative for chest pain.  Gastrointestinal:  Positive for nausea (prior to a headache coming on).  Musculoskeletal:  Positive for myalgias and neck pain (related to  headache).  Neurological:  Positive for headaches.  Psychiatric/Behavioral: Negative.     Physical Exam Vitals reviewed.  Constitutional:      General: He is not in acute distress.    Appearance: He is well-developed. He is not ill-appearing.  HENT:     Head: Normocephalic and atraumatic.  Eyes:     Extraocular Movements: Extraocular movements intact.     Pupils: Pupils are equal, round, and reactive to light.  Cardiovascular:     Rate and Rhythm: Normal rate and regular rhythm.  Pulmonary:     Effort: Pulmonary effort is normal. No respiratory distress.  Skin:    General: Skin is warm and dry.  Neurological:     Mental Status: He is alert and oriented to person, place, and time.     GCS: GCS eye subscore is 4. GCS  verbal subscore is 5. GCS motor subscore is 6.     Coordination: Coordination normal.  Psychiatric:        Mood and Affect: Mood normal.        Behavior: Behavior normal.      Assessment/Plan: 1. Chronic tension-type headache, not intractable Fioricet prescribed for acute headache, amitriptyline prescribed for headache prevention. Follow up in 1 month.  - butalbital-acetaminophen-caffeine (FIORICET) 50-325-40 MG tablet; Take 1 tablet by mouth every 6 (six) hours as needed for headache or migraine.  Dispense: 30 tablet; Refill: 0 - amitriptyline (ELAVIL) 25 MG tablet; Take 1 tablet (25 mg total) by mouth at bedtime.  Dispense: 30 tablet; Refill: 2  2. Palpitations Palpitations seem to be present only when a tension headache is coming on. Will see if treating headache and adding treatment for prevention will resolve the palpitations, if he is still having palpitations, will do EKG in office at follow up visit.     General Counseling: edon hoadley understanding of the findings of todays visit and agrees with plan of treatment. I have discussed any further diagnostic evaluation that may be needed or ordered today. We also reviewed his medications today. he has been encouraged to call the office with any questions or concerns that should arise related to todays visit.    Counseling:    No orders of the defined types were placed in this encounter.   Meds ordered this encounter  Medications   butalbital-acetaminophen-caffeine (FIORICET) 50-325-40 MG tablet    Sig: Take 1 tablet by mouth every 6 (six) hours as needed for headache or migraine.    Dispense:  30 tablet    Refill:  0   amitriptyline (ELAVIL) 25 MG tablet    Sig: Take 1 tablet (25 mg total) by mouth at bedtime.    Dispense:  30 tablet    Refill:  2    Return in about 1 month (around 04/06/2021) for F/U, eval new med, [facility] PCP.  [place] Controlled Substance Database was reviewed by me for overdose risk score  (ORS)  Time spent:30 Minutes Time spent with patient included reviewing progress notes, labs, imaging studies, and discussing plan for follow up.   This patient was seen by Jonetta Osgood, FNP-C in collaboration with Dr. Clayborn Bigness as a part of collaborative care agreement.  Maytal Mijangos R. Valetta Fuller, MSN, FNP-C Internal Medicine

## 2021-03-11 DIAGNOSIS — M530 Cervicocranial syndrome: Secondary | ICD-10-CM | POA: Diagnosis not present

## 2021-03-11 DIAGNOSIS — M9901 Segmental and somatic dysfunction of cervical region: Secondary | ICD-10-CM | POA: Diagnosis not present

## 2021-03-11 DIAGNOSIS — M9902 Segmental and somatic dysfunction of thoracic region: Secondary | ICD-10-CM | POA: Diagnosis not present

## 2021-03-11 DIAGNOSIS — M531 Cervicobrachial syndrome: Secondary | ICD-10-CM | POA: Diagnosis not present

## 2021-03-13 DIAGNOSIS — M9902 Segmental and somatic dysfunction of thoracic region: Secondary | ICD-10-CM | POA: Diagnosis not present

## 2021-03-13 DIAGNOSIS — M531 Cervicobrachial syndrome: Secondary | ICD-10-CM | POA: Diagnosis not present

## 2021-03-13 DIAGNOSIS — M9901 Segmental and somatic dysfunction of cervical region: Secondary | ICD-10-CM | POA: Diagnosis not present

## 2021-03-13 DIAGNOSIS — M530 Cervicocranial syndrome: Secondary | ICD-10-CM | POA: Diagnosis not present

## 2021-03-18 DIAGNOSIS — M9901 Segmental and somatic dysfunction of cervical region: Secondary | ICD-10-CM | POA: Diagnosis not present

## 2021-03-18 DIAGNOSIS — M531 Cervicobrachial syndrome: Secondary | ICD-10-CM | POA: Diagnosis not present

## 2021-03-18 DIAGNOSIS — M9902 Segmental and somatic dysfunction of thoracic region: Secondary | ICD-10-CM | POA: Diagnosis not present

## 2021-03-18 DIAGNOSIS — M530 Cervicocranial syndrome: Secondary | ICD-10-CM | POA: Diagnosis not present

## 2021-03-27 DIAGNOSIS — M9902 Segmental and somatic dysfunction of thoracic region: Secondary | ICD-10-CM | POA: Diagnosis not present

## 2021-03-27 DIAGNOSIS — M531 Cervicobrachial syndrome: Secondary | ICD-10-CM | POA: Diagnosis not present

## 2021-03-27 DIAGNOSIS — M9901 Segmental and somatic dysfunction of cervical region: Secondary | ICD-10-CM | POA: Diagnosis not present

## 2021-03-27 DIAGNOSIS — M530 Cervicocranial syndrome: Secondary | ICD-10-CM | POA: Diagnosis not present

## 2021-03-31 DIAGNOSIS — M9902 Segmental and somatic dysfunction of thoracic region: Secondary | ICD-10-CM | POA: Diagnosis not present

## 2021-03-31 DIAGNOSIS — M9901 Segmental and somatic dysfunction of cervical region: Secondary | ICD-10-CM | POA: Diagnosis not present

## 2021-03-31 DIAGNOSIS — M530 Cervicocranial syndrome: Secondary | ICD-10-CM | POA: Diagnosis not present

## 2021-03-31 DIAGNOSIS — M531 Cervicobrachial syndrome: Secondary | ICD-10-CM | POA: Diagnosis not present

## 2021-04-03 DIAGNOSIS — M9901 Segmental and somatic dysfunction of cervical region: Secondary | ICD-10-CM | POA: Diagnosis not present

## 2021-04-03 DIAGNOSIS — M530 Cervicocranial syndrome: Secondary | ICD-10-CM | POA: Diagnosis not present

## 2021-04-03 DIAGNOSIS — M531 Cervicobrachial syndrome: Secondary | ICD-10-CM | POA: Diagnosis not present

## 2021-04-03 DIAGNOSIS — M9902 Segmental and somatic dysfunction of thoracic region: Secondary | ICD-10-CM | POA: Diagnosis not present

## 2021-04-07 ENCOUNTER — Telehealth: Payer: Self-pay

## 2021-04-07 NOTE — Telephone Encounter (Signed)
Left vm to confirm 04/08/21 appointment-Toni

## 2021-04-08 ENCOUNTER — Ambulatory Visit: Payer: BC Managed Care – PPO | Admitting: Nurse Practitioner

## 2021-04-08 DIAGNOSIS — M531 Cervicobrachial syndrome: Secondary | ICD-10-CM | POA: Diagnosis not present

## 2021-04-08 DIAGNOSIS — M9902 Segmental and somatic dysfunction of thoracic region: Secondary | ICD-10-CM | POA: Diagnosis not present

## 2021-04-08 DIAGNOSIS — M530 Cervicocranial syndrome: Secondary | ICD-10-CM | POA: Diagnosis not present

## 2021-04-08 DIAGNOSIS — M9901 Segmental and somatic dysfunction of cervical region: Secondary | ICD-10-CM | POA: Diagnosis not present

## 2021-04-14 ENCOUNTER — Encounter: Payer: Self-pay | Admitting: Nurse Practitioner

## 2021-04-14 ENCOUNTER — Other Ambulatory Visit: Payer: Self-pay

## 2021-04-14 ENCOUNTER — Ambulatory Visit: Payer: BC Managed Care – PPO | Admitting: Nurse Practitioner

## 2021-04-14 VITALS — BP 140/70 | HR 96 | Temp 98.7°F | Resp 16 | Ht 74.0 in | Wt 220.8 lb

## 2021-04-14 DIAGNOSIS — R030 Elevated blood-pressure reading, without diagnosis of hypertension: Secondary | ICD-10-CM | POA: Diagnosis not present

## 2021-04-14 DIAGNOSIS — G44229 Chronic tension-type headache, not intractable: Secondary | ICD-10-CM

## 2021-04-14 DIAGNOSIS — M10072 Idiopathic gout, left ankle and foot: Secondary | ICD-10-CM

## 2021-04-14 DIAGNOSIS — G4709 Other insomnia: Secondary | ICD-10-CM

## 2021-04-14 MED ORDER — BUTALBITAL-APAP-CAFFEINE 50-325-40 MG PO TABS: 1.0000 | ORAL_TABLET | Freq: Four times a day (QID) | ORAL | 1 refills | Status: DC | PRN

## 2021-04-14 MED ORDER — CYCLOBENZAPRINE HCL 5 MG PO TABS: 5.0000 mg | ORAL_TABLET | Freq: Every day | ORAL | 1 refills | Status: DC | PRN

## 2021-04-14 MED ORDER — AMITRIPTYLINE HCL 25 MG PO TABS: 25.0000 mg | ORAL_TABLET | Freq: Every day | ORAL | 2 refills | Status: DC

## 2021-04-14 NOTE — Progress Notes (Signed)
[facility] [place], [place] [address]  Internal MEDICINE  Office Visit Note  Patient Name: Jason Raymond  [id number]  [id number]  Date of Service: 04/14/2021  Chief Complaint  Patient presents with   Follow-up    Discuss meds   Gastroesophageal Reflux   Hypertension    HPI Padraig presents for follow-up visit for hypertension, gastroesophageal reflux, and insomnia.  At his previous office visit, he was having tension type headaches and was given a prescription for Fioricet which has helped to stop acute tension headaches.  He was also started on amitriptyline to help him sleep and he reports that he has been sleeping much better since starting this medication.  His blood pressure was significantly elevated at today's visit, but improved significantly when rechecked, see vitals.  Current medications are working well to control his gastroesophageal reflux.    Current Medication: Outpatient Encounter Medications as of 04/14/2021  Medication Sig   allopurinol (ZYLOPRIM) 100 MG tablet Take 1 tablet (100 mg total) by mouth daily   colchicine 0.6 MG tablet Take 1 tablet (0.6 mg total) by mouth daily.   cyclobenzaprine (FLEXERIL) 5 MG tablet Take 1 tablet (5 mg total) by mouth daily as needed for muscle spasms.   fluticasone (FLONASE) 50 MCG/ACT nasal spray PLACE 2 SPRAYS INTO BOTH NOSTRILS IN THE MORNING AND AT BEDTIME. *NEEDS APPT FOR REFILLS   levocetirizine (XYZAL) 5 MG tablet Take 5 mg by mouth every evening.   [DISCONTINUED] amitriptyline (ELAVIL) 25 MG tablet Take 1 tablet (25 mg total) by mouth at bedtime.   [DISCONTINUED] butalbital-acetaminophen-caffeine (FIORICET) 50-325-40 MG tablet Take 1 tablet by mouth every 6 (six) hours as needed for headache or migraine.   amitriptyline (ELAVIL) 25 MG tablet Take 1 tablet (25 mg total) by mouth at bedtime.   butalbital-acetaminophen-caffeine (FIORICET) 50-325-40 MG tablet Take 1 tablet by mouth every 6 (six) hours as  needed for headache or migraine.   [DISCONTINUED] diazepam (VALIUM) 5 MG tablet Take 1 tablet (5 mg total) by mouth once as needed for up to 1 dose (take 30 minutes prior to vasectomy). (Patient not taking: Reported on 04/14/2021)   No facility-administered encounter medications on file as of 04/14/2021.    Surgical History: Past Surgical History:  Procedure Laterality Date   DISTAL BICEPS TENDON REPAIR Right 09/01/2017   Procedure: DISTAL BICEPS TENDON REPAIR;  Surgeon: Earnestine Leys, MD;  Location: [facility] OR[facility];  Service: Orthopedics;  Laterality: Right;   SHOULDER ARTHROSCOPY WITH DEBRIDEMENT AND BICEP TENDON REPAIR Right 12/08/2016   Procedure: SHOULDER ARTHROSCOPY WITH LYSIS OF ADHESIONS, AND  CAPSULOTOMY.;  Surgeon: Thornton Park, MD;  Location: [facility] ORS;  Service: Orthopedics;  Laterality: Right;   SHOULDER ARTHROSCOPY WITH OPEN ROTATOR CUFF REPAIR Right 06/27/2015   Procedure: SHOULDER ARTHROSCOPIC labral repair WITH MINI OPEN ROTATOR CUFF REPAIR;  Surgeon: Thornton Park, MD;  Location: [facility] ORS;  Service: Orthopedics;  Laterality: Right;  [facility] emailed Rob Bagwell  Sleep Apnea YES  suppose to sleep with CPAP Machine but does not. 7-10 day Return     Medical History: Past Medical History:  Diagnosis Date   BMI 29.7-98.9,QJJHE    Complication of anesthesia    GERD (gastroesophageal reflux disease)    no meds   Gout    Headache    MIGRAINES   History of kidney stones    PONV (postoperative nausea and vomiting)    naueseated AFTER 1ST SURGERY ONLY   Sleep apnea    NO CPAP-PT STATES  HE HAS LOST WEIGHT AND HE THINKS THIS HAS HELPED     Family History: Family History  Problem Relation Age of Onset   Healthy Mother    Healthy Father     Social History   Socioeconomic History   Marital status: Married    Spouse name: Not on file   Number of children: Not on file   Years of education: Not on file   Highest education level: Not on file  Occupational History   Not  on file  Tobacco Use   Smoking status: Never   Smokeless tobacco: Former    Types: Snuff  Vaping Use   Vaping Use: Never used  Substance and Sexual Activity   Alcohol use: Yes    Comment: BEER 2-4 times a month    Drug use: No   Sexual activity: Not on file  Other Topics Concern   Not on file  Social History Narrative   Not on file   Social Determinants of Health   Financial Resource Strain: Not on file  Food Insecurity: Not on file  Transportation Needs: Not on file  Physical Activity: Not on file  Stress: Not on file  Social Connections: Not on file  Intimate Partner Violence: Not on file      Review of Systems  Constitutional:  Negative for chills, fatigue and unexpected weight change.  HENT:  Negative for congestion, rhinorrhea, sneezing and sore throat.   Eyes:  Negative for redness.  Respiratory:  Negative for cough, chest tightness and shortness of breath.   Cardiovascular:  Negative for chest pain and palpitations.  Gastrointestinal:  Negative for abdominal pain, constipation, diarrhea, nausea and vomiting.  Genitourinary:  Negative for dysuria and frequency.  Musculoskeletal:  Negative for arthralgias, back pain, joint swelling and neck pain.  Skin:  Negative for rash.  Neurological: Negative.  Negative for tremors and numbness.  Hematological:  Negative for adenopathy. Does not bruise/bleed easily.  Psychiatric/Behavioral:  Negative for behavioral problems (Depression), sleep disturbance and suicidal ideas. The patient is not nervous/anxious.    Vital Signs: BP 140/70 Comment: 175/108   Pulse 96    Temp 98.7 F (37.1 C)    Resp 16    Ht 6\' 2"  (1.88 m)    Wt 220 lb 12.8 oz (100.2 kg)    SpO2 98%    BMI 28.35 kg/m    Physical Exam Vitals reviewed.  Constitutional:      General: He is not in acute distress.    Appearance: Normal appearance. He is obese. He is not ill-appearing.  HENT:     Head: Normocephalic and atraumatic.  Eyes:     Pupils: Pupils  are equal, round, and reactive to light.  Cardiovascular:     Rate and Rhythm: Normal rate and regular rhythm.  Pulmonary:     Effort: Pulmonary effort is normal. No respiratory distress.  Neurological:     Mental Status: He is alert and oriented to person, place, and time.     Cranial Nerves: No cranial nerve deficit.     Gait: Gait normal.  Psychiatric:        Mood and Affect: Mood normal.        Behavior: Behavior normal.       Assessment/Plan: 1. Chronic tension-type headache, not intractable Meds are effective, refills ordered.  - amitriptyline (ELAVIL) 25 MG tablet; Take 1 tablet (25 mg total) by mouth at bedtime.  Dispense: 30 tablet; Refill: 2 - butalbital-acetaminophen-caffeine (FIORICET) 50-325-40 MG tablet;  Take 1 tablet by mouth every 6 (six) hours as needed for headache or migraine.  Dispense: 30 tablet; Refill: 1  2. Other insomnia Sleeping better, continue as prescribed.  - amitriptyline (ELAVIL) 25 MG tablet; Take 1 tablet (25 mg total) by mouth at bedtime.  Dispense: 30 tablet; Refill: 2  3. Elevated blood-pressure reading without diagnosis of hypertension Better once rechecked, see vitals.   4. Acute idiopathic gout involving toe of left foot Refills ordered - cyclobenzaprine (FLEXERIL) 5 MG tablet; Take 1 tablet (5 mg total) by mouth daily as needed for muscle spasms.  Dispense: 30 tablet; Refill: 1   General Counseling: Raahim verbalizes understanding of the findings of todays visit and agrees with plan of treatment. I have discussed any further diagnostic evaluation that may be needed or ordered today. We also reviewed his medications today. he has been encouraged to call the office with any questions or concerns that should arise related to todays visit.    No orders of the defined types were placed in this encounter.   Meds ordered this encounter  Medications   amitriptyline (ELAVIL) 25 MG tablet    Sig: Take 1 tablet (25 mg total) by mouth at  bedtime.    Dispense:  30 tablet    Refill:  2   butalbital-acetaminophen-caffeine (FIORICET) 50-325-40 MG tablet    Sig: Take 1 tablet by mouth every 6 (six) hours as needed for headache or migraine.    Dispense:  30 tablet    Refill:  1   cyclobenzaprine (FLEXERIL) 5 MG tablet    Sig: Take 1 tablet (5 mg total) by mouth daily as needed for muscle spasms.    Dispense:  30 tablet    Refill:  1    Return in about 3 months (around 07/13/2021) for F/U, med refill, Kymani Laursen PCP.   Total time spent:30 Minutes Time spent includes review of chart, medications, test results, and follow up plan with the patient.   [place] Controlled Substance Database was reviewed by me.  This patient was seen by Jonetta Osgood, FNP-C in collaboration with Dr. Clayborn Bigness as a part of collaborative care agreement.   Ame Heagle R. Valetta Fuller, MSN, FNP-C Internal medicine

## 2021-04-17 DIAGNOSIS — M530 Cervicocranial syndrome: Secondary | ICD-10-CM | POA: Diagnosis not present

## 2021-04-17 DIAGNOSIS — M531 Cervicobrachial syndrome: Secondary | ICD-10-CM | POA: Diagnosis not present

## 2021-04-17 DIAGNOSIS — M9902 Segmental and somatic dysfunction of thoracic region: Secondary | ICD-10-CM | POA: Diagnosis not present

## 2021-04-17 DIAGNOSIS — M9901 Segmental and somatic dysfunction of cervical region: Secondary | ICD-10-CM | POA: Diagnosis not present

## 2021-05-07 ENCOUNTER — Telehealth: Payer: Self-pay

## 2021-05-07 DIAGNOSIS — M9901 Segmental and somatic dysfunction of cervical region: Secondary | ICD-10-CM | POA: Diagnosis not present

## 2021-05-07 DIAGNOSIS — M9902 Segmental and somatic dysfunction of thoracic region: Secondary | ICD-10-CM | POA: Diagnosis not present

## 2021-05-07 DIAGNOSIS — M530 Cervicocranial syndrome: Secondary | ICD-10-CM | POA: Diagnosis not present

## 2021-05-07 DIAGNOSIS — M531 Cervicobrachial syndrome: Secondary | ICD-10-CM | POA: Diagnosis not present

## 2021-05-07 NOTE — Telephone Encounter (Signed)
Left vm to confirm 05/08/21 appointment-Toni

## 2021-05-08 ENCOUNTER — Encounter: Payer: BC Managed Care – PPO | Admitting: Nurse Practitioner

## 2021-05-16 ENCOUNTER — Encounter: Payer: Self-pay | Admitting: Nurse Practitioner

## 2021-06-02 DIAGNOSIS — M530 Cervicocranial syndrome: Secondary | ICD-10-CM | POA: Diagnosis not present

## 2021-06-02 DIAGNOSIS — M9901 Segmental and somatic dysfunction of cervical region: Secondary | ICD-10-CM | POA: Diagnosis not present

## 2021-06-02 DIAGNOSIS — M531 Cervicobrachial syndrome: Secondary | ICD-10-CM | POA: Diagnosis not present

## 2021-06-02 DIAGNOSIS — M9902 Segmental and somatic dysfunction of thoracic region: Secondary | ICD-10-CM | POA: Diagnosis not present

## 2021-07-05 ENCOUNTER — Other Ambulatory Visit: Payer: Self-pay | Admitting: Nurse Practitioner

## 2021-07-05 DIAGNOSIS — M10072 Idiopathic gout, left ankle and foot: Secondary | ICD-10-CM

## 2021-07-14 ENCOUNTER — Ambulatory Visit (INDEPENDENT_AMBULATORY_CARE_PROVIDER_SITE_OTHER): Payer: BC Managed Care – PPO | Admitting: Nurse Practitioner

## 2021-07-14 ENCOUNTER — Encounter: Payer: Self-pay | Admitting: Nurse Practitioner

## 2021-07-14 ENCOUNTER — Other Ambulatory Visit: Payer: Self-pay

## 2021-07-14 VITALS — BP 140/80 | HR 98 | Temp 98.2°F | Resp 16 | Ht 74.0 in | Wt 219.0 lb

## 2021-07-14 DIAGNOSIS — G44229 Chronic tension-type headache, not intractable: Secondary | ICD-10-CM

## 2021-07-14 DIAGNOSIS — E559 Vitamin D deficiency, unspecified: Secondary | ICD-10-CM | POA: Diagnosis not present

## 2021-07-14 DIAGNOSIS — E538 Deficiency of other specified B group vitamins: Secondary | ICD-10-CM

## 2021-07-14 DIAGNOSIS — Z1212 Encounter for screening for malignant neoplasm of rectum: Secondary | ICD-10-CM

## 2021-07-14 DIAGNOSIS — Z0001 Encounter for general adult medical examination with abnormal findings: Secondary | ICD-10-CM | POA: Diagnosis not present

## 2021-07-14 DIAGNOSIS — Z1211 Encounter for screening for malignant neoplasm of colon: Secondary | ICD-10-CM

## 2021-07-14 DIAGNOSIS — R300 Dysuria: Secondary | ICD-10-CM

## 2021-07-14 DIAGNOSIS — E782 Mixed hyperlipidemia: Secondary | ICD-10-CM

## 2021-07-14 DIAGNOSIS — R030 Elevated blood-pressure reading, without diagnosis of hypertension: Secondary | ICD-10-CM | POA: Diagnosis not present

## 2021-07-14 MED ORDER — SUMATRIPTAN SUCCINATE 50 MG PO TABS: 50.0000 mg | ORAL_TABLET | Freq: Every day | ORAL | 0 refills | Status: DC | PRN

## 2021-07-14 MED ORDER — TOPIRAMATE 25 MG PO TABS: 25.0000 mg | ORAL_TABLET | Freq: Two times a day (BID) | ORAL | 2 refills | Status: DC

## 2021-07-14 NOTE — Progress Notes (Signed)
[facility] ?[address] ?[place], [place] [address] ? ?Internal MEDICINE  ?Office Visit Note ? ?Patient Name: Jason Raymond ? [id number]  ?[id number] ? ?Date of Service: 07/14/2021 ? ?Chief Complaint  ?Patient presents with  ? Annual Exam  ?  Still having headaches  ? Gastroesophageal Reflux  ? Quality Metric Gaps  ?  colonoscopy  ? ? ?HPI ?Jason Raymond presents for an annual well visit and physical exam.  He is a well appearing 46 yo male with chronic tension headaches and no other chronic medical problems. He does have elevated blood pressure without diagnosis of hypertension. But this improves when rechecked. He is due for colorectal cancer screening and has no history of colorectal cancer in his family. He is opting for the cologuard stool test. He is also due for routine labs.  ? ? ? ?Current Medication: ?Outpatient Encounter Medications as of 07/14/2021  ?Medication Sig  ? allopurinol (ZYLOPRIM) 100 MG tablet Take 1 tablet (100 mg total) by mouth daily  ? butalbital-acetaminophen-caffeine (FIORICET) 50-325-40 MG tablet Take 1 tablet by mouth every 6 (six) hours as needed for headache or migraine.  ? colchicine 0.6 MG tablet Take 1 tablet (0.6 mg total) by mouth daily.  ? cyclobenzaprine (FLEXERIL) 5 MG tablet Take 1 tablet (5 mg total) by mouth daily as needed for muscle spasms.  ? fluticasone (FLONASE) 50 MCG/ACT nasal spray PLACE 2 SPRAYS INTO BOTH NOSTRILS IN THE MORNING AND AT BEDTIME. *NEEDS APPT FOR REFILLS  ? levocetirizine (XYZAL) 5 MG tablet Take 5 mg by mouth every evening.  ? SUMAtriptan (IMITREX) 50 MG tablet Take 1 tablet (50 mg total) by mouth daily as needed for migraine or headache. May repeat in 2 hours if headache persists or recurs x1 additional dose.  ? [DISCONTINUED] amitriptyline (ELAVIL) 25 MG tablet Take 1 tablet (25 mg total) by mouth at bedtime.  ? [DISCONTINUED] topiramate (TOPAMAX) 25 MG tablet Take 1 tablet (25 mg total) by mouth 2 (two) times daily.  ? ?No facility-administered  encounter medications on file as of 07/14/2021.  ? ? ?Surgical History: ?Past Surgical History:  ?Procedure Laterality Date  ? DISTAL BICEPS TENDON REPAIR Right 09/01/2017  ? Procedure: DISTAL BICEPS TENDON REPAIR;  Surgeon: Earnestine Leys, MD;  Location: [facility];  Service: Orthopedics;  Laterality: Right;  ? SHOULDER ARTHROSCOPY WITH DEBRIDEMENT AND BICEP TENDON REPAIR Right 12/08/2016  ? Procedure: SHOULDER ARTHROSCOPY WITH LYSIS OF ADHESIONS, AND  CAPSULOTOMY.;  Surgeon: Thornton Park, MD;  Location: [facility];  Service: Orthopedics;  Laterality: Right;  ? SHOULDER ARTHROSCOPY WITH OPEN ROTATOR CUFF REPAIR Right 06/27/2015  ? Procedure: SHOULDER ARTHROSCOPIC labral repair WITH MINI OPEN ROTATOR CUFF REPAIR;  Surgeon: Thornton Park, MD;  Location: [facility];  Service: Orthopedics;  Laterality: Right;  [facility] emailed Rob Newport  ?Sleep Apnea YES  suppose to sleep with CPAP Machine but does not. ?7-10 day Return ?  ? ? ?Medical History: ?Past Medical History:  ?Diagnosis Date  ? BMI 26.0-26.9,adult   ? Complication of anesthesia   ? GERD (gastroesophageal reflux disease)   ? no meds  ? Gout   ? Headache   ? MIGRAINES  ? History of kidney stones   ? PONV (postoperative nausea and vomiting)   ? naueseated AFTER 1ST SURGERY ONLY  ? Sleep apnea   ? NO CPAP-PT STATES HE HAS LOST WEIGHT AND HE THINKS THIS HAS HELPED   ? ? ?Family History: ?Family History  ?Problem Relation Age of Onset  ? Healthy  Mother   ? Healthy Father   ? ? ?Social History  ? ?Socioeconomic History  ? Marital status: Married  ?  Spouse name: Not on file  ? Number of children: Not on file  ? Years of education: Not on file  ? Highest education level: Not on file  ?Occupational History  ? Not on file  ?Tobacco Use  ? Smoking status: Never  ? Smokeless tobacco: Former  ?  Types: Snuff  ?Vaping Use  ? Vaping Use: Never used  ?Substance and Sexual Activity  ? Alcohol use: Yes  ?  Comment: BEER 2-4 times a month   ? Drug use: No  ? Sexual activity: Not  on file  ?Other Topics Concern  ? Not on file  ?Social History Narrative  ? Not on file  ? ?Social Determinants of Health  ? ?Financial Resource Strain: Not on file  ?Food Insecurity: Not on file  ?Transportation Needs: Not on file  ?Physical Activity: Not on file  ?Stress: Not on file  ?Social Connections: Not on file  ?Intimate Partner Violence: Not on file  ? ? ? ? ?Review of Systems  ?Constitutional:  Negative for activity change, appetite change, chills, fatigue, fever and unexpected weight change.  ?HENT: Negative.  Negative for congestion, ear pain, rhinorrhea, sore throat and trouble swallowing.   ?Eyes: Negative.   ?Respiratory: Negative.  Negative for cough, chest tightness, shortness of breath and wheezing.   ?Cardiovascular: Negative.  Negative for chest pain.  ?Gastrointestinal: Negative.  Negative for abdominal pain, blood in stool, constipation, diarrhea, nausea and vomiting.  ?Endocrine: Negative.   ?Genitourinary: Negative.  Negative for difficulty urinating, dysuria, frequency, hematuria and urgency.  ?Musculoskeletal: Negative.  Negative for arthralgias, back pain, joint swelling, myalgias and neck pain.  ?Skin: Negative.  Negative for rash and wound.  ?Allergic/Immunologic: Negative.  Negative for immunocompromised state.  ?Neurological:  Positive for headaches. Negative for dizziness, seizures and numbness.  ?Hematological: Negative.   ?Psychiatric/Behavioral: Negative.  Negative for behavioral problems, self-injury and suicidal ideas. The patient is not nervous/anxious.   ? ?Vital Signs: ?BP 140/80 Comment: 168/109  Pulse 98 Comment: 110  Temp 98.2 ?F (36.8 ?C)   Resp 16   Ht '6\' 2"'$  (1.88 m)   Wt 219 lb (99.3 kg)   SpO2 98%   BMI 28.12 kg/m?  ? ? ?Physical Exam ?Vitals reviewed.  ?Constitutional:   ?   General: He is awake. He is not in acute distress. ?   Appearance: Normal appearance. He is well-developed, well-groomed and overweight. He is not ill-appearing or diaphoretic.  ?HENT:  ?    Head: Normocephalic and atraumatic.  ?   Right Ear: Tympanic membrane, ear canal and external ear normal.  ?   Left Ear: Tympanic membrane, ear canal and external ear normal.  ?   Nose: Nose normal. No congestion or rhinorrhea.  ?   Mouth/Throat:  ?   Lips: Pink.  ?   Mouth: Mucous membranes are moist.  ?   Pharynx: Oropharynx is clear. Uvula midline. No oropharyngeal exudate or posterior oropharyngeal erythema.  ?Eyes:  ?   General: Lids are normal. Vision grossly intact. Gaze aligned appropriately. No scleral icterus.    ?   Right eye: No discharge.     ?   Left eye: No discharge.  ?   Conjunctiva/sclera: Conjunctivae normal.  ?   Pupils: Pupils are equal, round, and reactive to light.  ?   Funduscopic exam: ?   Right  eye: Red reflex present.     ?   Left eye: Red reflex present. ?Neck:  ?   Thyroid: No thyromegaly.  ?   Vascular: No JVD.  ?   Trachea: Trachea and phonation normal. No tracheal deviation.  ?Cardiovascular:  ?   Rate and Rhythm: Normal rate and regular rhythm.  ?   Pulses: Normal pulses.  ?   Heart sounds: Normal heart sounds, S1 normal and S2 normal. No murmur heard. ?  No friction rub. No gallop.  ?Pulmonary:  ?   Effort: Pulmonary effort is normal. No accessory muscle usage or respiratory distress.  ?   Breath sounds: Normal breath sounds and air entry. No stridor. No wheezing or rales.  ?Chest:  ?   Chest wall: No tenderness.  ?Abdominal:  ?   General: Bowel sounds are normal. There is no distension.  ?   Palpations: Abdomen is soft. There is no shifting dullness, fluid wave, mass or pulsatile mass.  ?   Tenderness: There is no abdominal tenderness. There is no guarding or rebound.  ?Musculoskeletal:     ?   General: No tenderness or deformity. Normal range of motion.  ?   Cervical back: Normal range of motion and neck supple.  ?   Right lower leg: No edema.  ?   Left lower leg: No edema.  ?Lymphadenopathy:  ?   Cervical: No cervical adenopathy.  ?Skin: ?   General: Skin is warm and dry.  ?    Capillary Refill: Capillary refill takes less than 2 seconds.  ?   Coloration: Skin is not pale.  ?   Findings: No erythema or rash.  ?Neurological:  ?   Mental Status: He is alert and oriented to person, plac

## 2021-07-15 ENCOUNTER — Encounter: Payer: Self-pay | Admitting: Nurse Practitioner

## 2021-07-15 ENCOUNTER — Other Ambulatory Visit: Payer: Self-pay

## 2021-07-15 LAB — UA/M W/RFLX CULTURE, ROUTINE
Bilirubin, UA: NEGATIVE
Glucose, UA: NEGATIVE
Ketones, UA: NEGATIVE
Leukocytes,UA: NEGATIVE
Nitrite, UA: NEGATIVE
Specific Gravity, UA: 1.017 (ref 1.005–1.030)
Urobilinogen, Ur: 0.2 mg/dL (ref 0.2–1.0)
pH, UA: 7 (ref 5.0–7.5)

## 2021-07-15 LAB — MICROSCOPIC EXAMINATION
Bacteria, UA: NONE SEEN
Casts: NONE SEEN /lpf
Epithelial Cells (non renal): NONE SEEN /hpf (ref 0–10)
WBC, UA: NONE SEEN /hpf (ref 0–5)

## 2021-07-15 MED ORDER — TOPIRAMATE 25 MG PO TABS: 25.0000 mg | ORAL_TABLET | Freq: Every day | ORAL | 2 refills | Status: DC

## 2021-07-27 ENCOUNTER — Encounter: Payer: Self-pay | Admitting: Nurse Practitioner

## 2021-08-13 DIAGNOSIS — R300 Dysuria: Secondary | ICD-10-CM | POA: Diagnosis not present

## 2021-08-13 DIAGNOSIS — R030 Elevated blood-pressure reading, without diagnosis of hypertension: Secondary | ICD-10-CM | POA: Diagnosis not present

## 2021-08-13 DIAGNOSIS — E782 Mixed hyperlipidemia: Secondary | ICD-10-CM | POA: Diagnosis not present

## 2021-08-13 DIAGNOSIS — G44229 Chronic tension-type headache, not intractable: Secondary | ICD-10-CM | POA: Diagnosis not present

## 2021-08-13 DIAGNOSIS — E559 Vitamin D deficiency, unspecified: Secondary | ICD-10-CM | POA: Diagnosis not present

## 2021-08-13 DIAGNOSIS — E538 Deficiency of other specified B group vitamins: Secondary | ICD-10-CM | POA: Diagnosis not present

## 2021-08-14 LAB — CMP14+EGFR
ALT: 10 IU/L (ref 0–44)
AST: 18 IU/L (ref 0–40)
Albumin/Globulin Ratio: 2.3 — ABNORMAL HIGH (ref 1.2–2.2)
Albumin: 4.5 g/dL (ref 4.0–5.0)
Alkaline Phosphatase: 78 IU/L (ref 44–121)
BUN/Creatinine Ratio: 11 (ref 9–20)
BUN: 13 mg/dL (ref 6–24)
Bilirubin Total: 1.5 mg/dL — ABNORMAL HIGH (ref 0.0–1.2)
CO2: 25 mmol/L (ref 20–29)
Calcium: 9.6 mg/dL (ref 8.7–10.2)
Chloride: 103 mmol/L (ref 96–106)
Creatinine, Ser: 1.17 mg/dL (ref 0.76–1.27)
Globulin, Total: 2 g/dL (ref 1.5–4.5)
Glucose: 88 mg/dL (ref 70–99)
Potassium: 4.9 mmol/L (ref 3.5–5.2)
Sodium: 141 mmol/L (ref 134–144)
Total Protein: 6.5 g/dL (ref 6.0–8.5)
eGFR: 78 mL/min/{1.73_m2} (ref >59)

## 2021-08-14 LAB — CBC WITH DIFFERENTIAL/PLATELET
Basophils Absolute: 0.1 10*3/uL (ref 0.0–0.2)
Basos: 1 %
EOS (ABSOLUTE): 0.2 10*3/uL (ref 0.0–0.4)
Eos: 2 %
Hematocrit: 44.9 % (ref 37.5–51.0)
Hemoglobin: 15.2 g/dL (ref 13.0–17.7)
Immature Grans (Abs): 0.1 10*3/uL (ref 0.0–0.1)
Immature Granulocytes: 1 %
Lymphocytes Absolute: 2 10*3/uL (ref 0.7–3.1)
Lymphs: 25 %
MCH: 29.1 pg (ref 26.6–33.0)
MCHC: 33.9 g/dL (ref 31.5–35.7)
MCV: 86 fL (ref 79–97)
Monocytes Absolute: 0.6 10*3/uL (ref 0.1–0.9)
Monocytes: 7 %
Neutrophils Absolute: 5.1 10*3/uL (ref 1.4–7.0)
Neutrophils: 64 %
Platelets: 350 10*3/uL (ref 150–450)
RBC: 5.23 x10E6/uL (ref 4.14–5.80)
RDW: 12.1 % (ref 11.6–15.4)
WBC: 7.9 10*3/uL (ref 3.4–10.8)

## 2021-08-14 LAB — LIPID PANEL
Chol/HDL Ratio: 6.6 ratio — ABNORMAL HIGH (ref 0.0–5.0)
Cholesterol, Total: 192 mg/dL (ref 100–199)
HDL: 29 mg/dL — ABNORMAL LOW (ref >39)
LDL Chol Calc (NIH): 131 mg/dL — ABNORMAL HIGH (ref 0–99)
Triglycerides: 179 mg/dL — ABNORMAL HIGH (ref 0–149)
VLDL Cholesterol Cal: 32 mg/dL (ref 5–40)

## 2021-08-14 LAB — TSH+FREE T4
Free T4: 1.27 ng/dL (ref 0.82–1.77)
TSH: 2.43 u[IU]/mL (ref 0.450–4.500)

## 2021-08-14 LAB — B12 AND FOLATE PANEL
Folate: >20 ng/mL (ref >3.0)
Vitamin B-12: 768 pg/mL (ref 232–1245)

## 2021-08-14 LAB — VITAMIN D 25 HYDROXY (VIT D DEFICIENCY, FRACTURES): Vit D, 25-Hydroxy: 27.7 ng/mL — ABNORMAL LOW (ref 30.0–100.0)

## 2021-08-18 ENCOUNTER — Encounter: Payer: Self-pay | Admitting: Nurse Practitioner

## 2021-08-18 ENCOUNTER — Ambulatory Visit: Payer: BC Managed Care – PPO | Admitting: Nurse Practitioner

## 2021-08-18 VITALS — BP 120/75 | HR 79 | Temp 98.5°F | Resp 16 | Ht 74.0 in | Wt 217.4 lb

## 2021-08-18 DIAGNOSIS — J0110 Acute frontal sinusitis, unspecified: Secondary | ICD-10-CM | POA: Diagnosis not present

## 2021-08-18 DIAGNOSIS — E559 Vitamin D deficiency, unspecified: Secondary | ICD-10-CM | POA: Diagnosis not present

## 2021-08-18 DIAGNOSIS — E782 Mixed hyperlipidemia: Secondary | ICD-10-CM | POA: Diagnosis not present

## 2021-08-18 DIAGNOSIS — G44229 Chronic tension-type headache, not intractable: Secondary | ICD-10-CM | POA: Diagnosis not present

## 2021-08-18 MED ORDER — ROSUVASTATIN CALCIUM 5 MG PO TABS: 5.0000 mg | ORAL_TABLET | Freq: Every day | ORAL | 1 refills | Status: DC

## 2021-08-18 MED ORDER — AZITHROMYCIN 250 MG PO TABS: ORAL_TABLET | ORAL | 0 refills | Status: AC

## 2021-08-18 MED ORDER — MONTELUKAST SODIUM 10 MG PO TABS: 10.0000 mg | ORAL_TABLET | Freq: Every day | ORAL | 3 refills | Status: DC

## 2021-08-18 NOTE — Progress Notes (Signed)
[facility] [place], [place] [address]  Internal MEDICINE  Office Visit Note  Patient Name: Jason Raymond  [id number]  [id number]  Date of Service: 08/18/2021  Chief Complaint  Patient presents with   Follow-up   Gastroesophageal Reflux   Sinusitis    Possible sinus infection, yellow mucus, right ear muffled, noticed about 3 weeks ago   Cough    HPI Cedric presents for a follow-up visit for tension headaches.  He also has a possible sinus infection with increased right ear pressure and sneezing cough and runny nose with yellow mucus. Reviewed recent labs with patient, his vitamin D level is slightly low, his cholesterol panel is significantly abnormal, his bilirubin is slightly elevated at 1.5.  His CBC, thyroid levels and B12 and folate are all normal. At his previous office visit he was started on topiramate twice daily to prevent tension headaches and this medication has been effective and is working well for the patient and he would like to continue he denies any significant adverse side effects of topiramate.   Current Medication: Outpatient Encounter Medications as of 08/18/2021  Medication Sig   allopurinol (ZYLOPRIM) 100 MG tablet Take 1 tablet (100 mg total) by mouth daily   azithromycin (ZITHROMAX) 250 MG tablet Take 2 tablets on day 1, then 1 tablet daily on days 2 through 5   butalbital-acetaminophen-caffeine (FIORICET) 50-325-40 MG tablet Take 1 tablet by mouth every 6 (six) hours as needed for headache or migraine.   colchicine 0.6 MG tablet Take 1 tablet (0.6 mg total) by mouth daily.   cyclobenzaprine (FLEXERIL) 5 MG tablet Take 1 tablet (5 mg total) by mouth daily as needed for muscle spasms.   fluticasone (FLONASE) 50 MCG/ACT nasal spray PLACE 2 SPRAYS INTO BOTH NOSTRILS IN THE MORNING AND AT BEDTIME. *NEEDS APPT FOR REFILLS   montelukast (SINGULAIR) 10 MG tablet Take 1 tablet (10 mg total) by mouth at bedtime.   rosuvastatin (CRESTOR) 5 MG  tablet Take 1 tablet (5 mg total) by mouth daily.   SUMAtriptan (IMITREX) 50 MG tablet Take 1 tablet (50 mg total) by mouth daily as needed for migraine or headache. May repeat in 2 hours if headache persists or recurs x1 additional dose.   topiramate (TOPAMAX) 25 MG tablet Take 1 tablet (25 mg total) by mouth daily.   [DISCONTINUED] levocetirizine (XYZAL) 5 MG tablet Take 5 mg by mouth every evening.   No facility-administered encounter medications on file as of 08/18/2021.    Surgical History: Past Surgical History:  Procedure Laterality Date   DISTAL BICEPS TENDON REPAIR Right 09/01/2017   Procedure: DISTAL BICEPS TENDON REPAIR;  Surgeon: Earnestine Leys, MD;  Location: [facility];  Service: Orthopedics;  Laterality: Right;   SHOULDER ARTHROSCOPY WITH DEBRIDEMENT AND BICEP TENDON REPAIR Right 12/08/2016   Procedure: SHOULDER ARTHROSCOPY WITH LYSIS OF ADHESIONS, AND  CAPSULOTOMY.;  Surgeon: Thornton Park, MD;  Location: [facility];  Service: Orthopedics;  Laterality: Right;   SHOULDER ARTHROSCOPY WITH OPEN ROTATOR CUFF REPAIR Right 06/27/2015   Procedure: SHOULDER ARTHROSCOPIC labral repair WITH MINI OPEN ROTATOR CUFF REPAIR;  Surgeon: Thornton Park, MD;  Location: [facility];  Service: Orthopedics;  Laterality: Right;  [facility] emailed Rob Bagwell  Sleep Apnea YES  suppose to sleep with CPAP Machine but does not. 7-10 day Return     Medical History: Past Medical History:  Diagnosis Date   BMI 49.6-75.9,FMBWG    Complication of anesthesia    GERD (gastroesophageal reflux disease)  no meds   Gout    Headache    MIGRAINES   History of kidney stones    PONV (postoperative nausea and vomiting)    naueseated AFTER 1ST SURGERY ONLY   Sleep apnea    NO CPAP-PT STATES HE HAS LOST WEIGHT AND HE THINKS THIS HAS HELPED     Family History: Family History  Problem Relation Age of Onset   Healthy Mother    Healthy Father     Social History   Socioeconomic History   Marital status:  Married    Spouse name: Not on file   Number of children: Not on file   Years of education: Not on file   Highest education level: Not on file  Occupational History   Not on file  Tobacco Use   Smoking status: Never   Smokeless tobacco: Former    Types: Snuff  Vaping Use   Vaping Use: Never used  Substance and Sexual Activity   Alcohol use: Yes    Comment: BEER 2-4 times a month    Drug use: No   Sexual activity: Not on file  Other Topics Concern   Not on file  Social History Narrative   Not on file   Social Determinants of Health   Financial Resource Strain: Not on file  Food Insecurity: Not on file  Transportation Needs: Not on file  Physical Activity: Not on file  Stress: Not on file  Social Connections: Not on file  Intimate Partner Violence: Not on file      Review of Systems  Constitutional:  Negative for chills, fatigue and unexpected weight change.  HENT:  Positive for congestion, ear pain, postnasal drip, rhinorrhea, sinus pressure, sinus pain, sneezing and sore throat. Negative for trouble swallowing.   Eyes:  Negative for redness.  Respiratory:  Positive for cough. Negative for chest tightness, shortness of breath and wheezing.   Cardiovascular:  Negative for chest pain and palpitations.  Gastrointestinal:  Negative for abdominal pain, constipation, diarrhea, nausea and vomiting.  Genitourinary:  Negative for dysuria and frequency.  Musculoskeletal:  Negative for arthralgias, back pain, joint swelling and neck pain.  Skin:  Negative for rash.  Neurological:  Positive for headaches. Negative for tremors and numbness.  Hematological:  Negative for adenopathy. Does not bruise/bleed easily.  Psychiatric/Behavioral:  Negative for behavioral problems (Depression), sleep disturbance and suicidal ideas. The patient is not nervous/anxious.    Vital Signs: BP 120/75   Pulse 79   Temp 98.5 F (36.9 C)   Resp 16   Ht '6\' 2"'$  (1.88 m)   Wt 217 lb 6.4 oz (98.6 kg)    SpO2 99%   BMI 27.91 kg/m    Physical Exam Vitals reviewed.  Constitutional:      General: He is not in acute distress.    Appearance: Normal appearance. He is ill-appearing.  HENT:     Nose: Congestion present.  Eyes:     Pupils: Pupils are equal, round, and reactive to light.  Cardiovascular:     Rate and Rhythm: Normal rate and regular rhythm.  Pulmonary:     Effort: Pulmonary effort is normal. No respiratory distress.  Neurological:     Mental Status: He is alert.       Assessment/Plan: 1. Acute non-recurrent frontal sinusitis Empiric antibiotic treatment prescribed. Prescription for montelukast sent to pharmacy to help alleviate symptoms that are also associated with allergic rhinitis.  - azithromycin (ZITHROMAX) 250 MG tablet; Take 2 tablets on day 1,  then 1 tablet daily on days 2 through 5  Dispense: 6 tablet; Refill: 0 - montelukast (SINGULAIR) 10 MG tablet; Take 1 tablet (10 mg total) by mouth at bedtime.  Dispense: 30 tablet; Refill: 3  2. Chronic tension-type headache, not intractable Topiramate 25 mg twice daily is working well for tension headache prevention  3. Mixed hyperlipidemia Start rosuvastatin 5 mg daily for significant hyperlipidemia.  - rosuvastatin (CRESTOR) 5 MG tablet; Take 1 tablet (5 mg total) by mouth daily.  Dispense: 90 tablet; Refill: 1  4. Vitamin D deficiency Vitamin D level is slight low, encouraged patient to start an OTC vitamin D supplement 2000-5000 units daily.   General Counseling: jacqueline delapena understanding of the findings of todays visit and agrees with plan of treatment. I have discussed any further diagnostic evaluation that may be needed or ordered today. We also reviewed his medications today. he has been encouraged to call the office with any questions or concerns that should arise related to todays visit.    No orders of the defined types were placed in this encounter.   Meds ordered this encounter  Medications    azithromycin (ZITHROMAX) 250 MG tablet    Sig: Take 2 tablets on day 1, then 1 tablet daily on days 2 through 5    Dispense:  6 tablet    Refill:  0   rosuvastatin (CRESTOR) 5 MG tablet    Sig: Take 1 tablet (5 mg total) by mouth daily.    Dispense:  90 tablet    Refill:  1   montelukast (SINGULAIR) 10 MG tablet    Sig: Take 1 tablet (10 mg total) by mouth at bedtime.    Dispense:  30 tablet    Refill:  3    Return in about 3 months (around 11/17/2021) for F/U, med refill, Tina Gruner PCP.   Total time spent:30 Minutes Time spent includes review of chart, medications, test results, and follow up plan with the patient.   [place] Controlled Substance Database was reviewed by me.  This patient was seen by Jonetta Osgood, FNP-C in collaboration with Dr. Clayborn Bigness as a part of collaborative care agreement.   Landis Cassaro R. Valetta Fuller, MSN, FNP-C Internal medicine

## 2021-09-20 ENCOUNTER — Encounter: Payer: Self-pay | Admitting: Nurse Practitioner

## 2021-10-16 ENCOUNTER — Other Ambulatory Visit: Payer: Self-pay | Admitting: Nurse Practitioner

## 2021-10-23 IMAGING — CR DG CHEST 2V
2 series · 2 of 2 positions shown · non-contrast
Comparison: None.

CLINICAL DATA: Cough and fever

EXAM:
CHEST - 2 VIEW

[chest pa]
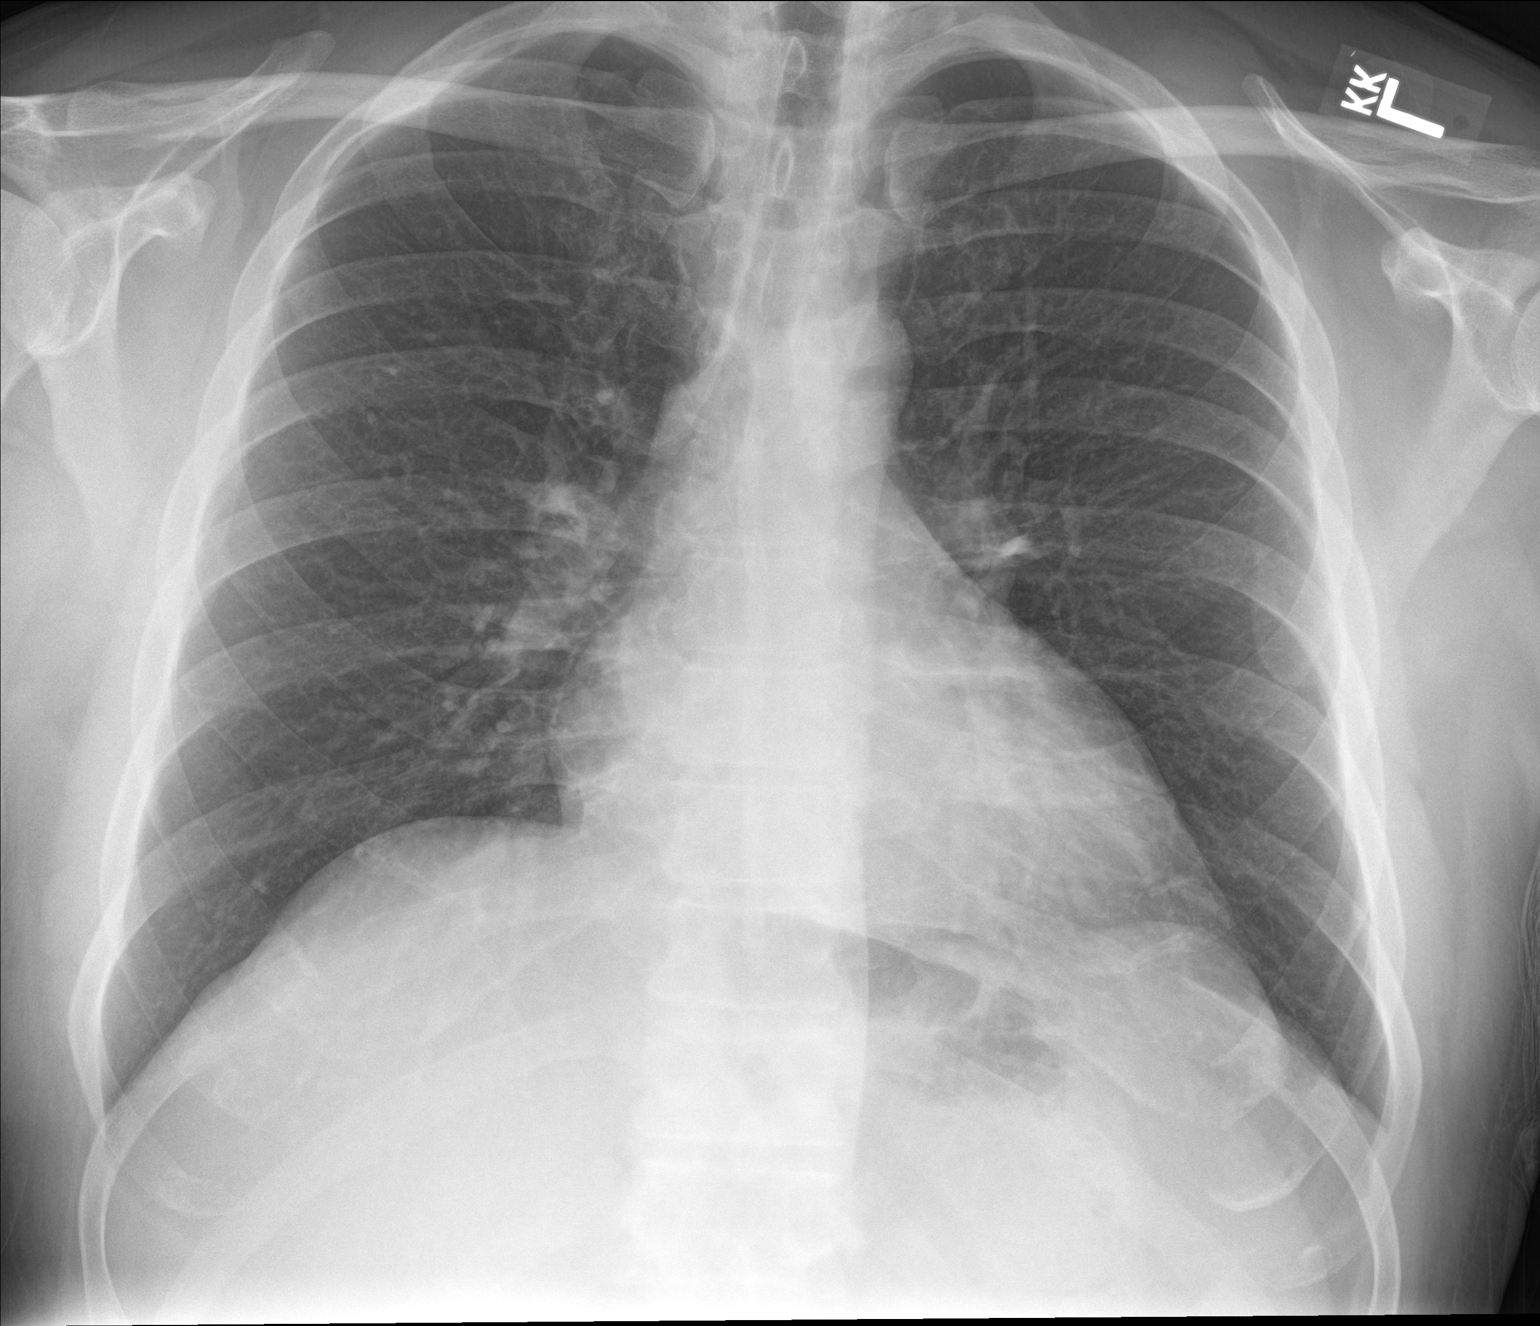

[chest lat]
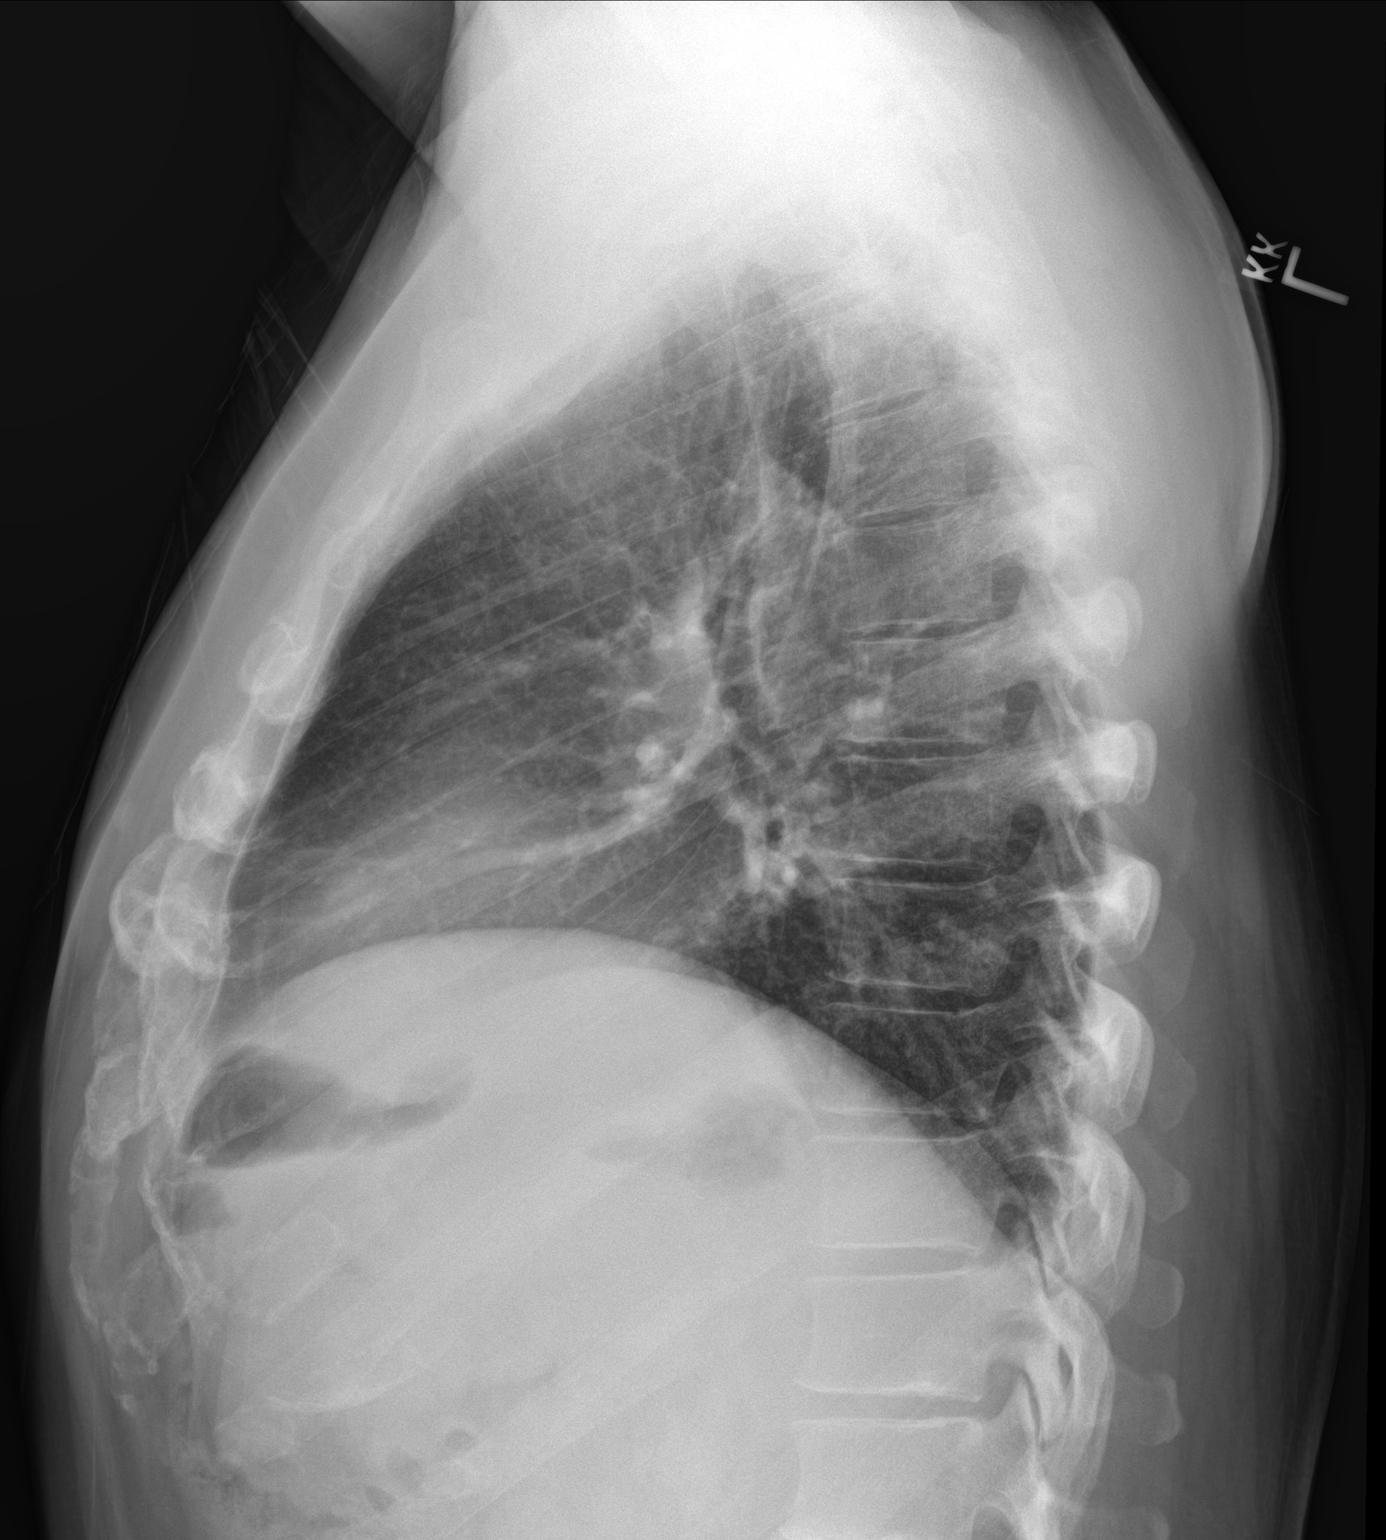

[2 of 2 positions shown; findings below may reference images not displayed]

FINDINGS: The heart size and mediastinal contours are within normal limits.
Both lungs are clear. The visualized skeletal structures are
unremarkable.
IMPRESSION: No active cardiopulmonary disease.

## 2021-12-12 ENCOUNTER — Other Ambulatory Visit: Payer: Self-pay | Admitting: Nurse Practitioner

## 2021-12-12 DIAGNOSIS — J0110 Acute frontal sinusitis, unspecified: Secondary | ICD-10-CM

## 2021-12-14 ENCOUNTER — Other Ambulatory Visit: Payer: Self-pay | Admitting: Nurse Practitioner

## 2021-12-14 DIAGNOSIS — J0110 Acute frontal sinusitis, unspecified: Secondary | ICD-10-CM

## 2021-12-17 ENCOUNTER — Other Ambulatory Visit: Payer: Self-pay

## 2021-12-17 DIAGNOSIS — M10072 Idiopathic gout, left ankle and foot: Secondary | ICD-10-CM

## 2021-12-17 DIAGNOSIS — M10021 Idiopathic gout, right elbow: Secondary | ICD-10-CM

## 2021-12-17 MED ORDER — COLCHICINE 0.6 MG PO TABS: 0.6000 mg | ORAL_TABLET | Freq: Every day | ORAL | 3 refills | Status: DC

## 2022-01-14 ENCOUNTER — Other Ambulatory Visit: Payer: Self-pay | Admitting: Nurse Practitioner

## 2022-02-12 ENCOUNTER — Encounter: Payer: Self-pay | Admitting: Nurse Practitioner

## 2022-02-12 ENCOUNTER — Ambulatory Visit (INDEPENDENT_AMBULATORY_CARE_PROVIDER_SITE_OTHER): Payer: BC Managed Care – PPO | Admitting: Nurse Practitioner

## 2022-02-12 VITALS — BP 130/70 | HR 63 | Temp 97.9°F | Resp 16 | Ht 74.0 in | Wt 220.2 lb

## 2022-02-12 DIAGNOSIS — M10021 Idiopathic gout, right elbow: Secondary | ICD-10-CM

## 2022-02-12 DIAGNOSIS — J0110 Acute frontal sinusitis, unspecified: Secondary | ICD-10-CM

## 2022-02-12 DIAGNOSIS — R030 Elevated blood-pressure reading, without diagnosis of hypertension: Secondary | ICD-10-CM

## 2022-02-12 DIAGNOSIS — R5383 Other fatigue: Secondary | ICD-10-CM

## 2022-02-12 DIAGNOSIS — Z760 Encounter for issue of repeat prescription: Secondary | ICD-10-CM | POA: Diagnosis not present

## 2022-02-12 DIAGNOSIS — E782 Mixed hyperlipidemia: Secondary | ICD-10-CM

## 2022-02-12 DIAGNOSIS — R6882 Decreased libido: Secondary | ICD-10-CM | POA: Diagnosis not present

## 2022-02-12 DIAGNOSIS — G44229 Chronic tension-type headache, not intractable: Secondary | ICD-10-CM

## 2022-02-12 DIAGNOSIS — M10072 Idiopathic gout, left ankle and foot: Secondary | ICD-10-CM

## 2022-02-12 MED ORDER — SILDENAFIL CITRATE 50 MG PO TABS: 25.0000 mg | ORAL_TABLET | Freq: Every day | ORAL | 0 refills | Status: AC | PRN

## 2022-02-12 MED ORDER — ALLOPURINOL 100 MG PO TABS: 100.0000 mg | ORAL_TABLET | Freq: Every day | ORAL | 3 refills | Status: AC

## 2022-02-12 MED ORDER — ROSUVASTATIN CALCIUM 5 MG PO TABS: 5.0000 mg | ORAL_TABLET | Freq: Every day | ORAL | 1 refills | Status: DC

## 2022-02-12 MED ORDER — BUTALBITAL-APAP-CAFFEINE 50-325-40 MG PO TABS: 1.0000 | ORAL_TABLET | Freq: Four times a day (QID) | ORAL | 1 refills | Status: DC | PRN

## 2022-02-12 MED ORDER — CYCLOBENZAPRINE HCL 5 MG PO TABS: 5.0000 mg | ORAL_TABLET | Freq: Every day | ORAL | 1 refills | Status: AC | PRN

## 2022-02-12 MED ORDER — TOPIRAMATE 25 MG PO TABS: ORAL_TABLET | ORAL | 1 refills | Status: DC

## 2022-02-12 MED ORDER — MONTELUKAST SODIUM 10 MG PO TABS: 10.0000 mg | ORAL_TABLET | Freq: Every day | ORAL | 3 refills | Status: AC

## 2022-02-12 NOTE — Progress Notes (Signed)
[facility] [place], [place] [address]  Internal MEDICINE  Office Visit Note  Patient Name: Jason Raymond  [id number]  [id number]  Date of Service: 02/12/2022  Chief Complaint  Patient presents with   Follow-up   Gastroesophageal Reflux    HPI Jason Raymond presents for a follow up visit for medication refills, tension headache, GERD, and high blood pressure.  Decreased libido -- wants to check testosterone levels and do HRT if levels are low. Also wants to try medication to help.  Tension headaches, much better controlled with current medications.  GERD is stable. BP elevated, improved when rechecked.     Current Medication: Outpatient Encounter Medications as of 02/12/2022  Medication Sig   colchicine 0.6 MG tablet Take 1 tablet (0.6 mg total) by mouth daily.   fluticasone (FLONASE) 50 MCG/ACT nasal spray PLACE 2 SPRAYS INTO BOTH NOSTRILS IN THE MORNING AND AT BEDTIME. *NEEDS APPT FOR REFILLS   sildenafil (VIAGRA) 50 MG tablet Take 0.5-2 tablets (25-100 mg total) by mouth daily as needed for erectile dysfunction.   [DISCONTINUED] allopurinol (ZYLOPRIM) 100 MG tablet Take 1 tablet (100 mg total) by mouth daily   [DISCONTINUED] butalbital-acetaminophen-caffeine (FIORICET) 50-325-40 MG tablet Take 1 tablet by mouth every 6 (six) hours as needed for headache or migraine.   [DISCONTINUED] cyclobenzaprine (FLEXERIL) 5 MG tablet Take 1 tablet (5 mg total) by mouth daily as needed for muscle spasms.   [DISCONTINUED] montelukast (SINGULAIR) 10 MG tablet Take 1 tablet (10 mg total) by mouth at bedtime.   [DISCONTINUED] rosuvastatin (CRESTOR) 5 MG tablet Take 1 tablet (5 mg total) by mouth daily.   [DISCONTINUED] SUMAtriptan (IMITREX) 50 MG tablet Take 1 tablet (50 mg total) by mouth daily as needed for migraine or headache. May repeat in 2 hours if headache persists or recurs x1 additional dose.   [DISCONTINUED] topiramate (TOPAMAX) 25 MG tablet Take 1 tablet (25 mg  total) by mouth once a day.   allopurinol (ZYLOPRIM) 100 MG tablet Take 1 tablet (100 mg total) by mouth daily.   butalbital-acetaminophen-caffeine (FIORICET) 50-325-40 MG tablet Take 1 tablet by mouth every 6 (six) hours as needed for headache or migraine.   cyclobenzaprine (FLEXERIL) 5 MG tablet Take 1 tablet (5 mg total) by mouth daily as needed for muscle spasms.   montelukast (SINGULAIR) 10 MG tablet Take 1 tablet (10 mg total) by mouth at bedtime.   rosuvastatin (CRESTOR) 5 MG tablet Take 1 tablet (5 mg total) by mouth daily.   topiramate (TOPAMAX) 25 MG tablet Take 1 tablet (25 mg total) by mouth once a day.   No facility-administered encounter medications on file as of 02/12/2022.    Surgical History: Past Surgical History:  Procedure Laterality Date   DISTAL BICEPS TENDON REPAIR Right 09/01/2017   Procedure: DISTAL BICEPS TENDON REPAIR;  Surgeon: Earnestine Leys, MD;  Location: [facility] ORS;  Service: Orthopedics;  Laterality: Right;   SHOULDER ARTHROSCOPY WITH DEBRIDEMENT AND BICEP TENDON REPAIR Right 12/08/2016   Procedure: SHOULDER ARTHROSCOPY WITH LYSIS OF ADHESIONS, AND  CAPSULOTOMY.;  Surgeon: Thornton Park, MD;  Location: [facility] ORS;  Service: Orthopedics;  Laterality: Right;   SHOULDER ARTHROSCOPY WITH OPEN ROTATOR CUFF REPAIR Right 06/27/2015   Procedure: SHOULDER ARTHROSCOPIC labral repair WITH MINI OPEN ROTATOR CUFF REPAIR;  Surgeon: Thornton Park, MD;  Location: [facility] ORS;  Service: Orthopedics;  Laterality: Right;  Arthrocare emailed Rob Bagwell  Sleep Apnea YES  suppose to sleep with CPAP Machine but does not. 7-10 day Return  Medical History: Past Medical History:  Diagnosis Date   BMI 02.5-85.2,DPOEU    Complication of anesthesia    GERD (gastroesophageal reflux disease)    no meds   Gout    Headache    MIGRAINES   History of kidney stones    PONV (postoperative nausea and vomiting)    naueseated AFTER 1ST SURGERY ONLY   Sleep apnea    NO CPAP-PT STATES HE  HAS LOST WEIGHT AND HE THINKS THIS HAS HELPED     Family History: Family History  Problem Relation Age of Onset   Healthy Mother    Healthy Father     Social History   Socioeconomic History   Marital status: Married    Spouse name: Not on file   Number of children: Not on file   Years of education: Not on file   Highest education level: Not on file  Occupational History   Not on file  Tobacco Use   Smoking status: Never   Smokeless tobacco: Former    Types: Snuff  Vaping Use   Vaping Use: Never used  Substance and Sexual Activity   Alcohol use: Yes    Comment: BEER 2-4 times a month    Drug use: No   Sexual activity: Not on file  Other Topics Concern   Not on file  Social History Narrative   Not on file   Social Determinants of Health   Financial Resource Strain: Not on file  Food Insecurity: Not on file  Transportation Needs: Not on file  Physical Activity: Not on file  Stress: Not on file  Social Connections: Not on file  Intimate Partner Violence: Not on file      Review of Systems  Constitutional:  Positive for fatigue. Negative for chills and unexpected weight change.  HENT:  Negative for congestion, rhinorrhea, sneezing and sore throat.   Eyes:  Negative for redness.  Respiratory:  Negative for cough, chest tightness and shortness of breath.   Cardiovascular:  Negative for chest pain and palpitations.  Gastrointestinal:  Negative for abdominal pain, constipation, diarrhea, nausea and vomiting.  Genitourinary:  Negative for dysuria and frequency.       Decreased libido  Musculoskeletal:  Negative for arthralgias, back pain, joint swelling and neck pain.  Skin:  Negative for rash.  Neurological: Negative.  Negative for tremors and numbness.  Hematological:  Negative for adenopathy. Does not bruise/bleed easily.  Psychiatric/Behavioral:  Negative for behavioral problems (Depression), sleep disturbance and suicidal ideas. The patient is not  nervous/anxious.     Vital Signs: BP 130/70 Comment: 146/96  Pulse 63   Temp 97.9 F (36.6 C)   Resp 16   Ht '6\' 2"'$  (1.88 m)   Wt 220 lb 3.2 oz (99.9 kg)   SpO2 99%   BMI 28.27 kg/m    Physical Exam Vitals reviewed.  Constitutional:      General: He is not in acute distress.    Appearance: Normal appearance. He is obese. He is not ill-appearing.  HENT:     Head: Normocephalic and atraumatic.  Eyes:     Pupils: Pupils are equal, round, and reactive to light.  Cardiovascular:     Rate and Rhythm: Normal rate and regular rhythm.  Pulmonary:     Effort: Pulmonary effort is normal. No respiratory distress.  Neurological:     Mental Status: He is alert and oriented to person, place, and time.     Cranial Nerves: No cranial nerve deficit.  Gait: Gait normal.  Psychiatric:        Mood and Affect: Mood normal.        Behavior: Behavior normal.        Assessment/Plan: 1. Decreased libido Check testosterone levels and may take sildenafil prn as prescribed.  - Testosterone,Free and Total - sildenafil (VIAGRA) 50 MG tablet; Take 0.5-2 tablets (25-100 mg total) by mouth daily as needed for erectile dysfunction.  Dispense: 10 tablet; Refill: 0  2. Other fatigue Check testosterone, if low, is likely reason for fatigue - Testosterone,Free and Total  3. Elevated blood-pressure reading without diagnosis of hypertension Improved BP when rechecked  4. Medication refill - topiramate (TOPAMAX) 25 MG tablet; Take 1 tablet (25 mg total) by mouth once a day.  Dispense: 90 tablet; Refill: 1 - rosuvastatin (CRESTOR) 5 MG tablet; Take 1 tablet (5 mg total) by mouth daily.  Dispense: 90 tablet; Refill: 1 - montelukast (SINGULAIR) 10 MG tablet; Take 1 tablet (10 mg total) by mouth at bedtime.  Dispense: 90 tablet; Refill: 3 - cyclobenzaprine (FLEXERIL) 5 MG tablet; Take 1 tablet (5 mg total) by mouth daily as needed for muscle spasms.  Dispense: 30 tablet; Refill: 1 -  butalbital-acetaminophen-caffeine (FIORICET) 50-325-40 MG tablet; Take 1 tablet by mouth every 6 (six) hours as needed for headache or migraine.  Dispense: 30 tablet; Refill: 1 - allopurinol (ZYLOPRIM) 100 MG tablet; Take 1 tablet (100 mg total) by mouth daily.  Dispense: 90 tablet; Refill: 3   General Counseling: Gregorio verbalizes understanding of the findings of todays visit and agrees with plan of treatment. I have discussed any further diagnostic evaluation that may be needed or ordered today. We also reviewed his medications today. he has been encouraged to call the office with any questions or concerns that should arise related to todays visit.    Orders Placed This Encounter  Procedures   Testosterone,Free and Total    Meds ordered this encounter  Medications   topiramate (TOPAMAX) 25 MG tablet    Sig: Take 1 tablet (25 mg total) by mouth once a day.    Dispense:  90 tablet    Refill:  1    Please change to once a daily   rosuvastatin (CRESTOR) 5 MG tablet    Sig: Take 1 tablet (5 mg total) by mouth daily.    Dispense:  90 tablet    Refill:  1   montelukast (SINGULAIR) 10 MG tablet    Sig: Take 1 tablet (10 mg total) by mouth at bedtime.    Dispense:  90 tablet    Refill:  3   cyclobenzaprine (FLEXERIL) 5 MG tablet    Sig: Take 1 tablet (5 mg total) by mouth daily as needed for muscle spasms.    Dispense:  30 tablet    Refill:  1   butalbital-acetaminophen-caffeine (FIORICET) 50-325-40 MG tablet    Sig: Take 1 tablet by mouth every 6 (six) hours as needed for headache or migraine.    Dispense:  30 tablet    Refill:  1   allopurinol (ZYLOPRIM) 100 MG tablet    Sig: Take 1 tablet (100 mg total) by mouth daily.    Dispense:  90 tablet    Refill:  3   sildenafil (VIAGRA) 50 MG tablet    Sig: Take 0.5-2 tablets (25-100 mg total) by mouth daily as needed for erectile dysfunction.    Dispense:  10 tablet    Refill:  0    Fill script  today thanks    Return for CPE, Skylier Kretschmer  PCP, previously scheduled in 6 months, and otherwise prn.   Total time spent:30 Minutes Time spent includes review of chart, medications, test results, and follow up plan with the patient.   [place] Controlled Substance Database was reviewed by me.  This patient was seen by Jonetta Osgood, FNP-C in collaboration with Dr. Clayborn Bigness as a part of collaborative care agreement.   Yamin Swingler R. Valetta Fuller, MSN, FNP-C Internal medicine

## 2022-02-13 ENCOUNTER — Ambulatory Visit: Payer: BC Managed Care – PPO | Admitting: Nurse Practitioner

## 2022-02-20 DIAGNOSIS — R6882 Decreased libido: Secondary | ICD-10-CM | POA: Diagnosis not present

## 2022-02-20 DIAGNOSIS — R5383 Other fatigue: Secondary | ICD-10-CM | POA: Diagnosis not present

## 2022-02-25 LAB — TESTOSTERONE,FREE AND TOTAL
Testosterone, Free: 4.9 pg/mL — ABNORMAL LOW (ref 6.8–21.5)
Testosterone: 180 ng/dL — ABNORMAL LOW (ref 264–916)

## 2022-02-26 ENCOUNTER — Telehealth: Payer: Self-pay

## 2022-02-27 ENCOUNTER — Encounter: Payer: Self-pay | Admitting: Nurse Practitioner

## 2022-03-03 NOTE — Telephone Encounter (Signed)
Jason Raymond will send pres

## 2022-03-04 ENCOUNTER — Other Ambulatory Visit: Payer: Self-pay | Admitting: Nurse Practitioner

## 2022-03-04 DIAGNOSIS — R6882 Decreased libido: Secondary | ICD-10-CM

## 2022-03-04 DIAGNOSIS — E291 Testicular hypofunction: Secondary | ICD-10-CM

## 2022-03-04 MED ORDER — TESTOSTERONE CYPIONATE 100 MG/ML IM SOLN: 100.0000 mg | INTRAMUSCULAR | 0 refills | Status: DC

## 2022-03-04 MED ORDER — "BD LUER-LOK SYRINGE 21G X 1"" 3 ML MISC": 1 refills | Status: DC

## 2022-03-28 ENCOUNTER — Encounter: Payer: Self-pay | Admitting: Nurse Practitioner

## 2022-04-16 NOTE — Telephone Encounter (Signed)
Jason Raymond already send him message

## 2022-06-09 ENCOUNTER — Other Ambulatory Visit: Payer: Self-pay | Admitting: Nurse Practitioner

## 2022-06-10 NOTE — Telephone Encounter (Signed)
Lat 10/23 and next 4/24

## 2022-07-20 ENCOUNTER — Encounter: Payer: BC Managed Care – PPO | Admitting: Nurse Practitioner

## 2022-07-27 ENCOUNTER — Ambulatory Visit (INDEPENDENT_AMBULATORY_CARE_PROVIDER_SITE_OTHER): Payer: BC Managed Care – PPO | Admitting: Nurse Practitioner

## 2022-07-27 ENCOUNTER — Encounter: Payer: Self-pay | Admitting: Nurse Practitioner

## 2022-07-27 VITALS — BP 135/85 | HR 99 | Temp 98.0°F | Resp 16 | Ht 74.0 in | Wt 219.0 lb

## 2022-07-27 DIAGNOSIS — E291 Testicular hypofunction: Secondary | ICD-10-CM

## 2022-07-27 DIAGNOSIS — E559 Vitamin D deficiency, unspecified: Secondary | ICD-10-CM

## 2022-07-27 DIAGNOSIS — M10072 Idiopathic gout, left ankle and foot: Secondary | ICD-10-CM

## 2022-07-27 DIAGNOSIS — Z1211 Encounter for screening for malignant neoplasm of colon: Secondary | ICD-10-CM

## 2022-07-27 DIAGNOSIS — Z0001 Encounter for general adult medical examination with abnormal findings: Secondary | ICD-10-CM

## 2022-07-27 DIAGNOSIS — Z1212 Encounter for screening for malignant neoplasm of rectum: Secondary | ICD-10-CM

## 2022-07-27 DIAGNOSIS — E782 Mixed hyperlipidemia: Secondary | ICD-10-CM | POA: Diagnosis not present

## 2022-07-27 DIAGNOSIS — M1A0720 Idiopathic chronic gout, left ankle and foot, without tophus (tophi): Secondary | ICD-10-CM

## 2022-07-27 NOTE — Progress Notes (Signed)
[facility] [place], [place] [address]  Internal MEDICINE  Office Visit Note  Patient Name: Jason Raymond  [id number]  [id number]  Date of Service: 07/27/2022  Chief Complaint  Patient presents with   Gastroesophageal Reflux   Annual Exam    HPI Jason Raymond presents for an annual well visit and physical exam.  Well-appearing 47 y.o. male with low testosterone, low vitamin D and hx of gout. No other significant medical problems. Routine CRC screening: opt for cologuard, reordered.  Labs: due for routine labs Testosterone replacement therapy -- feeling much better than Jason Raymond has in years.  New or worsening pain -- some pain in right index finger with slight swelling but may be arthritis.   Other concerns: none.  Due for routine labs and monitoring for HRT.     Current Medication: Outpatient Encounter Medications as of 07/27/2022  Medication Sig   allopurinol (ZYLOPRIM) 100 MG tablet Take 1 tablet (100 mg total) by mouth daily.   butalbital-acetaminophen-caffeine (FIORICET) 50-325-40 MG tablet Take 1 tablet by mouth every 6 (six) hours as needed for headache or migraine.   colchicine 0.6 MG tablet Take 1 tablet (0.6 mg total) by mouth daily.   cyclobenzaprine (FLEXERIL) 5 MG tablet Take 1 tablet (5 mg total) by mouth daily as needed for muscle spasms.   fluticasone (FLONASE) 50 MCG/ACT nasal spray PLACE 2 SPRAYS INTO BOTH NOSTRILS IN THE MORNING AND AT BEDTIME. *NEEDS APPT FOR REFILLS   montelukast (SINGULAIR) 10 MG tablet Take 1 tablet (10 mg total) by mouth at bedtime.   rosuvastatin (CRESTOR) 5 MG tablet Take 1 tablet (5 mg total) by mouth daily.   sildenafil (VIAGRA) 50 MG tablet Take 0.5-2 tablets (25-100 mg total) by mouth daily as needed for erectile dysfunction.   SYRINGE-NEEDLE, DISP, 3 ML (B-D 3CC LUER-LOK SYR 21GX1") 21G X 1" 3 ML MISC Use 1 3-ml syringe with 21g x1" needle to administer IM testosterone cypionate every 28 days   testosterone cypionate  (DEPOTESTOTERONE CYPIONATE) 100 MG/ML injection Inject 1 mL (100 mg total) into the muscle every 28 (twenty-eight) days. For IM use only   topiramate (TOPAMAX) 25 MG tablet Take 1 tablet (25 mg total) by mouth once a day.   No facility-administered encounter medications on file as of 07/27/2022.    Surgical History: Past Surgical History:  Procedure Laterality Date   DISTAL BICEPS TENDON REPAIR Right 09/01/2017   Procedure: DISTAL BICEPS TENDON REPAIR;  Surgeon: Earnestine Leys, MD;  Location: [facility] ORS;  Service: Orthopedics;  Laterality: Right;   SHOULDER ARTHROSCOPY WITH DEBRIDEMENT AND BICEP TENDON REPAIR Right 12/08/2016   Procedure: SHOULDER ARTHROSCOPY WITH LYSIS OF ADHESIONS, AND  CAPSULOTOMY.;  Surgeon: Thornton Park, MD;  Location: [facility] ORS;  Service: Orthopedics;  Laterality: Right;   SHOULDER ARTHROSCOPY WITH OPEN ROTATOR CUFF REPAIR Right 06/27/2015   Procedure: SHOULDER ARTHROSCOPIC labral repair WITH MINI OPEN ROTATOR CUFF REPAIR;  Surgeon: Thornton Park, MD;  Location: [facility] ORS;  Service: Orthopedics;  Laterality: Right;  Arthrocare emailed Jason Raymond  Sleep Apnea YES  suppose to sleep with CPAP Machine but does not. 7-10 day Return     Medical History: Past Medical History:  Diagnosis Date   BMI 123456    Complication of anesthesia    GERD (gastroesophageal reflux disease)    no meds   Gout    Headache    MIGRAINES   History of kidney stones    PONV (postoperative nausea and vomiting)    naueseated  AFTER 1ST SURGERY ONLY   Sleep apnea    NO CPAP-PT STATES Jason Raymond HAS LOST WEIGHT AND Jason Raymond THINKS THIS HAS HELPED     Family History: Family History  Problem Relation Age of Onset   Healthy Mother    Healthy Father     Social History   Socioeconomic History   Marital status: Married    Spouse name: Not on file   Number of children: Not on file   Years of education: Not on file   Highest education level: Not on file  Occupational History   Not on file   Tobacco Use   Smoking status: Never   Smokeless tobacco: Former    Types: Snuff  Vaping Use   Vaping Use: Never used  Substance and Sexual Activity   Alcohol use: Yes    Comment: BEER 2-4 times a month    Drug use: No   Sexual activity: Not on file  Other Topics Concern   Not on file  Social History Narrative   Not on file   Social Determinants of Health   Financial Resource Strain: Not on file  Food Insecurity: Not on file  Transportation Needs: Not on file  Physical Activity: Not on file  Stress: Not on file  Social Connections: Not on file  Intimate Partner Violence: Not on file      Review of Systems  Constitutional:  Positive for fatigue (greatly improved with HRT). Negative for activity change, appetite change, chills, fever and unexpected weight change.  HENT: Negative.  Negative for congestion, ear pain, rhinorrhea, sore throat and trouble swallowing.   Eyes: Negative.   Respiratory: Negative.  Negative for cough, chest tightness, shortness of breath and wheezing.   Cardiovascular: Negative.  Negative for chest pain and palpitations.  Gastrointestinal: Negative.  Negative for abdominal pain, blood in stool, constipation, diarrhea, nausea and vomiting.  Endocrine: Negative.   Genitourinary: Negative.  Negative for difficulty urinating, dysuria, frequency, hematuria and urgency.  Musculoskeletal: Negative.  Negative for arthralgias, back pain, joint swelling, myalgias and neck pain.  Skin: Negative.  Negative for rash and wound.  Allergic/Immunologic: Negative.  Negative for immunocompromised state.  Neurological:  Positive for headaches (well controlled with topiramate for prevention). Negative for dizziness, seizures and numbness.  Hematological: Negative.   Psychiatric/Behavioral: Negative.  Negative for behavioral problems, self-injury and suicidal ideas. The patient is not nervous/anxious.     Vital Signs: BP 135/85 Comment: 161/98  Pulse 99   Temp 98 F  (36.7 C)   Resp 16   Ht 6\' 2"  (1.88 m)   Wt 219 lb (99.3 kg)   SpO2 99%   BMI 28.12 kg/m    Physical Exam Vitals reviewed.  Constitutional:      General: Jason Raymond is awake. Jason Raymond is not in acute distress.    Appearance: Normal appearance. Jason Raymond is well-developed, well-groomed and overweight. Jason Raymond is not ill-appearing or diaphoretic.  HENT:     Head: Normocephalic and atraumatic.     Right Ear: Tympanic membrane, ear canal and external ear normal.     Left Ear: Tympanic membrane, ear canal and external ear normal.     Nose: Nose normal. No congestion or rhinorrhea.     Mouth/Throat:     Lips: Pink.     Mouth: Mucous membranes are moist.     Pharynx: Oropharynx is clear. Uvula midline. No oropharyngeal exudate or posterior oropharyngeal erythema.  Eyes:     General: Lids are normal. Vision grossly intact. Gaze aligned  appropriately. No scleral icterus.       Right eye: No discharge.        Left eye: No discharge.     Conjunctiva/sclera: Conjunctivae normal.     Pupils: Pupils are equal, round, and reactive to light.     Funduscopic exam:    Right eye: Red reflex present.        Left eye: Red reflex present. Neck:     Thyroid: No thyromegaly.     Vascular: No JVD.     Trachea: Trachea and phonation normal. No tracheal deviation.  Cardiovascular:     Rate and Rhythm: Normal rate and regular rhythm.     Pulses: Normal pulses.     Heart sounds: Normal heart sounds, S1 normal and S2 normal. No murmur heard.    No friction rub. No gallop.  Pulmonary:     Effort: Pulmonary effort is normal. No accessory muscle usage or respiratory distress.     Breath sounds: Normal breath sounds and air entry. No stridor. No wheezing or rales.  Chest:     Chest wall: No tenderness.  Abdominal:     General: Bowel sounds are normal. There is no distension.     Palpations: Abdomen is soft. There is no shifting dullness, fluid wave, mass or pulsatile mass.     Tenderness: There is no abdominal tenderness.  There is no guarding or rebound.  Musculoskeletal:        General: No tenderness or deformity. Normal range of motion.     Cervical back: Normal range of motion and neck supple.     Right lower leg: No edema.     Left lower leg: No edema.  Lymphadenopathy:     Cervical: No cervical adenopathy.  Skin:    General: Skin is warm and dry.     Capillary Refill: Capillary refill takes less than 2 seconds.     Coloration: Skin is not pale.     Findings: No erythema or rash.  Neurological:     Mental Status: Jason Raymond is alert and oriented to person, place, and time.     Cranial Nerves: No cranial nerve deficit.     Motor: No abnormal muscle tone.     Coordination: Coordination normal.     Gait: Gait normal.     Deep Tendon Reflexes: Reflexes are normal and symmetric.  Psychiatric:        Mood and Affect: Mood normal.        Behavior: Behavior normal. Behavior is cooperative.        Thought Content: Thought content normal.        Judgment: Judgment normal.        Assessment/Plan: 1. Encounter for routine adult health examination with abnormal findings Age-appropriate preventive screenings and vaccinations discussed, annual physical exam completed. Routine labs for health maintenance ordered, see below. PHM updated.  - Testosterone,Free and Total - Lipid Profile - CMP14+EGFR - CBC with Differential/Platelet - Vitamin D (25 hydroxy) - Uric acid  2. Hypogonadism in male Labs ordered for routine monitoring during HRT, will reassess current dosing based on testosterone level and other labs.  - Testosterone,Free and Total - Lipid Profile - CMP14+EGFR - CBC with Differential/Platelet  3. Mixed hyperlipidemia Routine labs ordered - Testosterone,Free and Total - Lipid Profile - CMP14+EGFR - CBC with Differential/Platelet  4. Vitamin D deficiency Routine lab ordered - Vitamin D (25 hydroxy)  5. Screening for colorectal cancer Lost his cologuard kit, reordered  - Cologuard  6.  Chronic idiopathic gout involving toe of left foot without tophus No further gout attacks since starting medication during his initial gout attack. Has not been taking allopurinol but has been taking colchicine daily. Instructed patient to take allopurinol daily for prevention and save colchicine for acute flares/attacks.  Uric acid level ordered - Uric acid     General Counseling: Jason Raymond verbalizes understanding of the findings of todays visit and agrees with plan of treatment. I have discussed any further diagnostic evaluation that may be needed or ordered today. We also reviewed his medications today. Jason Raymond has been encouraged to call the office with any questions or concerns that should arise related to todays visit.    Orders Placed This Encounter  Procedures   Testosterone,Free and Total   Lipid Profile   CMP14+EGFR   CBC with Differential/Platelet   Vitamin D (25 hydroxy)   Cologuard   Uric acid    No orders of the defined types were placed in this encounter.   Return in about 3 months (around 10/26/2022) for F/U, Avamae Dehaan PCP HRT monitoring.   Total time spent:30 Minutes Time spent includes review of chart, medications, test results, and follow up plan with the patient.   [place] Controlled Substance Database was reviewed by me.  This patient was seen by Jonetta Osgood, FNP-C in collaboration with Dr. Clayborn Bigness as a part of collaborative care agreement.  Shakur Lembo R. Valetta Fuller, MSN, FNP-C Internal medicine

## 2022-07-28 ENCOUNTER — Encounter: Payer: Self-pay | Admitting: Nurse Practitioner

## 2022-07-28 DIAGNOSIS — E291 Testicular hypofunction: Secondary | ICD-10-CM | POA: Insufficient documentation

## 2022-07-28 DIAGNOSIS — M1A0720 Idiopathic chronic gout, left ankle and foot, without tophus (tophi): Secondary | ICD-10-CM | POA: Insufficient documentation

## 2022-07-28 DIAGNOSIS — E559 Vitamin D deficiency, unspecified: Secondary | ICD-10-CM | POA: Insufficient documentation

## 2022-08-06 DIAGNOSIS — Z1212 Encounter for screening for malignant neoplasm of rectum: Secondary | ICD-10-CM | POA: Diagnosis not present

## 2022-08-06 DIAGNOSIS — Z0001 Encounter for general adult medical examination with abnormal findings: Secondary | ICD-10-CM | POA: Diagnosis not present

## 2022-08-06 DIAGNOSIS — E559 Vitamin D deficiency, unspecified: Secondary | ICD-10-CM | POA: Diagnosis not present

## 2022-08-06 DIAGNOSIS — E782 Mixed hyperlipidemia: Secondary | ICD-10-CM | POA: Diagnosis not present

## 2022-08-06 DIAGNOSIS — E291 Testicular hypofunction: Secondary | ICD-10-CM | POA: Diagnosis not present

## 2022-08-10 LAB — LIPID PANEL
Chol/HDL Ratio: 4.8 ratio (ref 0.0–5.0)
Cholesterol, Total: 140 mg/dL (ref 100–199)
HDL: 29 mg/dL — ABNORMAL LOW (ref >39)
LDL Chol Calc (NIH): 84 mg/dL (ref 0–99)
Triglycerides: 154 mg/dL — ABNORMAL HIGH (ref 0–149)
VLDL Cholesterol Cal: 27 mg/dL (ref 5–40)

## 2022-08-10 LAB — VITAMIN D 25 HYDROXY (VIT D DEFICIENCY, FRACTURES): Vit D, 25-Hydroxy: 26.1 ng/mL — ABNORMAL LOW (ref 30.0–100.0)

## 2022-08-10 LAB — CMP14+EGFR
ALT: 14 IU/L (ref 0–44)
AST: 22 IU/L (ref 0–40)
Albumin/Globulin Ratio: 2 (ref 1.2–2.2)
Albumin: 4.5 g/dL (ref 4.1–5.1)
Alkaline Phosphatase: 75 IU/L (ref 44–121)
BUN/Creatinine Ratio: 14 (ref 9–20)
BUN: 16 mg/dL (ref 6–24)
Bilirubin Total: 2.2 mg/dL — ABNORMAL HIGH (ref 0.0–1.2)
CO2: 22 mmol/L (ref 20–29)
Calcium: 9.2 mg/dL (ref 8.7–10.2)
Chloride: 105 mmol/L (ref 96–106)
Creatinine, Ser: 1.12 mg/dL (ref 0.76–1.27)
Globulin, Total: 2.3 g/dL (ref 1.5–4.5)
Glucose: 93 mg/dL (ref 70–99)
Potassium: 4.4 mmol/L (ref 3.5–5.2)
Sodium: 139 mmol/L (ref 134–144)
Total Protein: 6.8 g/dL (ref 6.0–8.5)
eGFR: 82 mL/min/{1.73_m2} (ref >59)

## 2022-08-10 LAB — URIC ACID: Uric Acid: 7.7 mg/dL (ref 3.8–8.4)

## 2022-08-10 LAB — CBC WITH DIFFERENTIAL/PLATELET
Basophils Absolute: 0 10*3/uL (ref 0.0–0.2)
Basos: 1 %
EOS (ABSOLUTE): 0.1 10*3/uL (ref 0.0–0.4)
Eos: 2 %
Hematocrit: 46.1 % (ref 37.5–51.0)
Hemoglobin: 15.4 g/dL (ref 13.0–17.7)
Immature Grans (Abs): 0.1 10*3/uL (ref 0.0–0.1)
Immature Granulocytes: 1 %
Lymphocytes Absolute: 1.6 10*3/uL (ref 0.7–3.1)
Lymphs: 30 %
MCH: 29.4 pg (ref 26.6–33.0)
MCHC: 33.4 g/dL (ref 31.5–35.7)
MCV: 88 fL (ref 79–97)
Monocytes Absolute: 0.5 10*3/uL (ref 0.1–0.9)
Monocytes: 9 %
Neutrophils Absolute: 3.1 10*3/uL (ref 1.4–7.0)
Neutrophils: 57 %
Platelets: 255 10*3/uL (ref 150–450)
RBC: 5.24 x10E6/uL (ref 4.14–5.80)
RDW: 12.7 % (ref 11.6–15.4)
WBC: 5.5 10*3/uL (ref 3.4–10.8)

## 2022-08-10 LAB — TESTOSTERONE,FREE AND TOTAL
Testosterone, Free: 6.2 pg/mL — ABNORMAL LOW (ref 6.8–21.5)
Testosterone: 225 ng/dL — ABNORMAL LOW (ref 264–916)

## 2022-08-18 ENCOUNTER — Other Ambulatory Visit: Payer: Self-pay | Admitting: Nurse Practitioner

## 2022-08-18 DIAGNOSIS — Z760 Encounter for issue of repeat prescription: Secondary | ICD-10-CM

## 2022-10-15 ENCOUNTER — Encounter: Payer: Self-pay | Admitting: Emergency Medicine

## 2022-10-15 ENCOUNTER — Ambulatory Visit
Admission: EM | Admit: 2022-10-15 | Discharge: 2022-10-15 | Disposition: A | Discharge status: Home / Self Care | Payer: BC Managed Care – PPO | Attending: Emergency Medicine | Admitting: Emergency Medicine

## 2022-10-15 DIAGNOSIS — U071 COVID-19: Secondary | ICD-10-CM | POA: Insufficient documentation

## 2022-10-15 LAB — SARS CORONAVIRUS 2 BY RT PCR: SARS Coronavirus 2 by RT PCR: POSITIVE — AB

## 2022-10-15 MED ORDER — BENZONATATE 100 MG PO CAPS: 200.0000 mg | ORAL_CAPSULE | Freq: Three times a day (TID) | ORAL | 0 refills | Status: AC

## 2022-10-15 MED ORDER — PROMETHAZINE-DM 6.25-15 MG/5ML PO SYRP: 5.0000 mL | ORAL_SOLUTION | Freq: Four times a day (QID) | ORAL | 0 refills | Status: AC | PRN

## 2022-10-15 MED ORDER — IPRATROPIUM BROMIDE 0.06 % NA SOLN: 2.0000 | Freq: Four times a day (QID) | NASAL | 12 refills | Status: AC

## 2022-10-15 NOTE — ED Triage Notes (Signed)
Pt presents with headache, chills and cough x 2 days. Home test confirmed Covid positive.

## 2022-10-15 NOTE — Discharge Instructions (Addendum)
You have tested positive for COVID.  You do need to wear a mask for the first 5 days of symptoms but there is no need to quarantine unless you are febrile.  If you develop a fever then you need to quarantine until after you have been fever free for 24 hours without taking over-the-counter Tylenol and/or ibuprofen.  Please use over-the-counter Tylenol and/or ibuprofen according to the package instructions as needed for fever, headache, or bodyaches.  Use the Atrovent nasal spray, 2 squirts of each nostril every 6 hours, as needed for nasal congestion and runny nose.  Use the Tessalon Perles every 8 hours during the day as needed for cough.  Take them with a small sip of water.  You may experience numbness to the base of your tongue or metallic taste in her mouth, this is normal.  Use the Promethazine DM cough syrup at bedtime as needed for cough and congestion.  This medication will make you drowsy so be mindful that if you have to get up in the middle of the night to use the restroom.  Make sure you have your bearings about you before you stand up and try to walk.  If you develop any worsening shortness of breath, shortness of breath at rest, feel as though you cannot catch her breath, feel as though you cannot speak in full sentences, or is a late sign your lips began turning blue you need to call 911 and go to the ER.

## 2022-10-15 NOTE — ED Provider Notes (Signed)
[facility]    CSN: [id number] Arrival date & time: 10/15/22  0825      History   Chief Complaint Chief Complaint  Patient presents with   Positive Covid Test     HPI Jason Raymond is a 47 y.o. male.   HPI  47 year old male with a past medical history significant for gout, GERD, headaches, sleep apnea, and history of renal stones presents for evaluation of respiratory symptoms that started 2 days ago.  He reports that his symptoms consist of runny nose, nasal congestion, sore throat, headache, chills, productive cough for yellow sputum, shortness of breath, and bodyaches.  He has not measured a fever at home and he denies ear pain, wheezing, GI symptoms, or known sick contacts.  He also denies any recent travel.  He did test positive for COVID at home yesterday and he has a photo of the positive test and his phone.  Past Medical History:  Diagnosis Date   BMI 26.0-26.9,adult    Complication of anesthesia    GERD (gastroesophageal reflux disease)    no meds   Gout    Headache    MIGRAINES   History of kidney stones    PONV (postoperative nausea and vomiting)    naueseated AFTER 1ST SURGERY ONLY   Sleep apnea    NO CPAP-PT STATES HE HAS LOST WEIGHT AND HE THINKS THIS HAS HELPED     Patient Active Problem List   Diagnosis Date Noted   Chronic idiopathic gout involving toe of left foot without tophus 07/28/2022   Vitamin D deficiency 07/28/2022   Hypogonadism in male 07/28/2022   BMI 26.0-26.9,adult    Neoplasm of uncertain behavior of skin of upper arm 10/17/2018   Neoplasm of uncertain behavior of skin of forearm 10/17/2018    Past Surgical History:  Procedure Laterality Date   DISTAL BICEPS TENDON REPAIR Right 09/01/2017   Procedure: DISTAL BICEPS TENDON REPAIR;  Surgeon: Deeann Saint, MD;  Location: [facility];  Service: Orthopedics;  Laterality: Right;   SHOULDER ARTHROSCOPY WITH DEBRIDEMENT AND BICEP TENDON REPAIR Right 12/08/2016   Procedure: SHOULDER  ARTHROSCOPY WITH LYSIS OF ADHESIONS, AND  CAPSULOTOMY.;  Surgeon: Juanell Fairly, MD;  Location: [facility];  Service: Orthopedics;  Laterality: Right;   SHOULDER ARTHROSCOPY WITH OPEN ROTATOR CUFF REPAIR Right 06/27/2015   Procedure: SHOULDER ARTHROSCOPIC labral repair WITH MINI OPEN ROTATOR CUFF REPAIR;  Surgeon: Juanell Fairly, MD;  Location: [facility];  Service: Orthopedics;  Laterality: Right;  Arthrocare emailed Rob Bagwell  Sleep Apnea YES  suppose to sleep with CPAP Machine but does not. 7-10 day Return        Home Medications    Prior to Admission medications   Medication Sig Start Date End Date Taking? Authorizing Provider  benzonatate (TESSALON) 100 MG capsule Take 2 capsules (200 mg total) by mouth every 8 (eight) hours. 10/15/22  Yes Becky Augusta, NP  ipratropium (ATROVENT) 0.06 % nasal spray Place 2 sprays into both nostrils 4 (four) times daily. 10/15/22  Yes Becky Augusta, NP  promethazine-dextromethorphan (PROMETHAZINE-DM) 6.25-15 MG/5ML syrup Take 5 mLs by mouth 4 (four) times daily as needed. 10/15/22  Yes Becky Augusta, NP  allopurinol (ZYLOPRIM) 100 MG tablet Take 1 tablet (100 mg total) by mouth daily. 02/12/22   Sallyanne Kuster, NP  butalbital-acetaminophen-caffeine (FIORICET) 50-325-40 MG tablet Take 1 tablet by mouth every 6 (six) hours as needed for headache or migraine. 02/12/22   Sallyanne Kuster, NP  colchicine 0.6 MG tablet Take 1  tablet (0.6 mg total) by mouth daily. 12/17/21   Sallyanne Kuster, NP  cyclobenzaprine (FLEXERIL) 5 MG tablet Take 1 tablet (5 mg total) by mouth daily as needed for muscle spasms. 02/12/22   Sallyanne Kuster, NP  fluticasone (FLONASE) 50 MCG/ACT nasal spray PLACE 2 SPRAYS INTO BOTH NOSTRILS IN THE MORNING AND AT BEDTIME. *NEEDS APPT FOR REFILLS 06/17/20   Lyndon Code, MD  montelukast (SINGULAIR) 10 MG tablet Take 1 tablet (10 mg total) by mouth at bedtime. 02/12/22   Sallyanne Kuster, NP  rosuvastatin (CRESTOR) 5 MG tablet Take 1 tablet  (5 mg total) by mouth daily. 08/19/22   Sallyanne Kuster, NP  sildenafil (VIAGRA) 50 MG tablet Take 0.5-2 tablets (25-100 mg total) by mouth daily as needed for erectile dysfunction. 02/12/22   Abernathy, Arlyss Repress, NP  SYRINGE-NEEDLE, DISP, 3 ML (B-D 3CC LUER-LOK SYR 21GX1") 21G X 1" 3 ML MISC Use 1 3-ml syringe with 21g x1" needle to administer IM testosterone cypionate every 28 days 03/04/22   Sallyanne Kuster, NP  testosterone cypionate (DEPOTESTOTERONE CYPIONATE) 100 MG/ML injection Inject 1 mL (100 mg total) into the muscle every 28 (twenty-eight) days. For IM use only 06/10/22   Sallyanne Kuster, NP  topiramate (TOPAMAX) 25 MG tablet Take 1 tablet (25 mg total) by mouth once a day. 08/19/22   Sallyanne Kuster, NP    Family History Family History  Problem Relation Age of Onset   Healthy Mother    Healthy Father     Social History Social History   Tobacco Use   Smoking status: Never   Smokeless tobacco: Former    Types: Snuff  Vaping Use   Vaping Use: Never used  Substance Use Topics   Alcohol use: Yes    Comment: BEER 2-4 times a month    Drug use: No     Allergies   Shellfish-derived products and Shellfish allergy   Review of Systems Review of Systems  Constitutional:  Positive for chills. Negative for fever.  HENT:  Positive for congestion, rhinorrhea and sore throat. Negative for ear pain.   Respiratory:  Positive for cough and shortness of breath. Negative for wheezing.   Gastrointestinal:  Negative for diarrhea, nausea and vomiting.  Musculoskeletal:  Positive for arthralgias and myalgias.  Neurological:  Positive for headaches.     Physical Exam Triage Vital Signs ED Triage Vitals  Enc Vitals Group     BP      Pulse      Resp      Temp      Temp src      SpO2      Weight      Height      Head Circumference      Peak Flow      Pain Score      Pain Loc      Pain Edu?      Excl. in GC?    No data found.  Updated Vital Signs BP 136/89 (BP  Location: Right Arm)   Pulse (!) 111   Temp 99.4 F (37.4 C) (Oral)   Resp 18   SpO2 98%   Visual Acuity Right Eye Distance:   Left Eye Distance:   Bilateral Distance:    Right Eye Near:   Left Eye Near:    Bilateral Near:     Physical Exam Vitals and nursing note reviewed.  Constitutional:      Appearance: Normal appearance. He is not ill-appearing.  HENT:  Head: Normocephalic and atraumatic.     Right Ear: Tympanic membrane, ear canal and external ear normal. There is no impacted cerumen.     Left Ear: Tympanic membrane, ear canal and external ear normal. There is no impacted cerumen.     Nose: Congestion and rhinorrhea present.     Comments: Nasal mucosa is erythematous and edematous with scant clear discharge in both nares.    Mouth/Throat:     Mouth: Mucous membranes are moist.     Pharynx: Oropharynx is clear. Posterior oropharyngeal erythema present. No oropharyngeal exudate.     Comments: Mild erythema to the posterior oropharynx with clear postnasal drip. Cardiovascular:     Rate and Rhythm: Normal rate and regular rhythm.     Pulses: Normal pulses.     Heart sounds: Normal heart sounds. No murmur heard.    No friction rub. No gallop.  Pulmonary:     Effort: Pulmonary effort is normal.     Breath sounds: Normal breath sounds. No wheezing, rhonchi or rales.  Musculoskeletal:     Cervical back: Normal range of motion and neck supple. No tenderness.  Lymphadenopathy:     Cervical: No cervical adenopathy.  Skin:    General: Skin is warm and dry.     Capillary Refill: Capillary refill takes less than 2 seconds.     Findings: No rash.  Neurological:     General: No focal deficit present.     Mental Status: He is alert and oriented to person, place, and time.      UC Treatments / Results  Labs (all labs ordered are listed, but only abnormal results are displayed) Labs Reviewed  SARS CORONAVIRUS 2 BY RT PCR - Abnormal; Notable for the following components:       Result Value   SARS Coronavirus 2 by RT PCR POSITIVE (*)    All other components within normal limits    EKG   Radiology No results found.  Procedures Procedures (including critical care time)  Medications Ordered in UC Medications - No data to display  Initial Impression / Assessment and Plan / UC Course  I have reviewed the triage vital signs and the nursing notes.  Pertinent labs & imaging results that were available during my care of the patient were reviewed by me and considered in my medical decision making (see chart for details).   Patient is a pleasant, nontoxic-appearing 47 year old male presenting for evaluation of respiratory symptoms in the setting of testing positive for COVID.  He has not had any known COVID exposure and he denies any recent travel.  He is not in any respiratory distress though he does report that he feels slightly short of breath.  He is able to speak in full sentence without dyspnea or tachypnea and his room air oxygen saturation is 98%.  He is not measured a fever at home and he has a mildly elevated temp of 99.4 here in clinic but no fever.  He does have a photo of the positive test on his phone but he states that his employer needs a definitive test from healthcare provider so I will order a COVID PCR.  We discussed antiviral therapy and patient has declined at this point.  He will except supportive care.  ER precautions reviewed.  COVID PCR is positive.  I will discharge patient on the diagnosis of COVID-19.  He is declining antivirals at this time.  I will prescribe Atrovent nasal spray, Tessalon Perles, and Promethazine DM cough  syrup to help with cough and congestion.  ER precautions reviewed.   Final Clinical Impressions(s) / UC Diagnoses   Final diagnoses:  COVID-19     Discharge Instructions      You have tested positive for COVID.  You do need to wear a mask for the first 5 days of symptoms but there is no need to quarantine  unless you are febrile.  If you develop a fever then you need to quarantine until after you have been fever free for 24 hours without taking over-the-counter Tylenol and/or ibuprofen.  Please use over-the-counter Tylenol and/or ibuprofen according to the package instructions as needed for fever, headache, or bodyaches.  Use the Atrovent nasal spray, 2 squirts of each nostril every 6 hours, as needed for nasal congestion and runny nose.  Use the Tessalon Perles every 8 hours during the day as needed for cough.  Take them with a small sip of water.  You may experience numbness to the base of your tongue or metallic taste in her mouth, this is normal.  Use the Promethazine DM cough syrup at bedtime as needed for cough and congestion.  This medication will make you drowsy so be mindful that if you have to get up in the middle of the night to use the restroom.  Make sure you have your bearings about you before you stand up and try to walk.  If you develop any worsening shortness of breath, shortness of breath at rest, feel as though you cannot catch her breath, feel as though you cannot speak in full sentences, or is a late sign your lips began turning blue you need to call 911 and go to the ER.     ED Prescriptions     Medication Sig Dispense Auth. Provider   benzonatate (TESSALON) 100 MG capsule Take 2 capsules (200 mg total) by mouth every 8 (eight) hours. 21 capsule Becky Augusta, NP   ipratropium (ATROVENT) 0.06 % nasal spray Place 2 sprays into both nostrils 4 (four) times daily. 15 mL Becky Augusta, NP   promethazine-dextromethorphan (PROMETHAZINE-DM) 6.25-15 MG/5ML syrup Take 5 mLs by mouth 4 (four) times daily as needed. 118 mL Becky Augusta, NP      PDMP not reviewed this encounter.   Becky Augusta, NP 10/15/22 [phone]

## 2022-10-16 ENCOUNTER — Telehealth: Payer: Self-pay | Admitting: Nurse Practitioner

## 2022-10-16 NOTE — Telephone Encounter (Signed)
Received [employer] disability and FMLA form. Gave to AA for signature-nm

## 2022-10-19 ENCOUNTER — Telehealth (INDEPENDENT_AMBULATORY_CARE_PROVIDER_SITE_OTHER): Payer: BC Managed Care – PPO | Admitting: Nurse Practitioner

## 2022-10-19 ENCOUNTER — Encounter: Payer: Self-pay | Admitting: Nurse Practitioner

## 2022-10-19 VITALS — Resp 16 | Ht 74.0 in | Wt 216.0 lb

## 2022-10-19 DIAGNOSIS — U071 COVID-19: Secondary | ICD-10-CM | POA: Diagnosis not present

## 2022-10-19 DIAGNOSIS — J069 Acute upper respiratory infection, unspecified: Secondary | ICD-10-CM

## 2022-10-19 DIAGNOSIS — E291 Testicular hypofunction: Secondary | ICD-10-CM | POA: Diagnosis not present

## 2022-10-19 MED ORDER — BD LUER-LOK SYRINGE 21G X 1" 3 ML MISC
1 refills | Status: AC
Start: 2022-10-19 — End: ?

## 2022-10-19 MED ORDER — TESTOSTERONE CYPIONATE 100 MG/ML IM SOLN
100.0000 mg | INTRAMUSCULAR | 2 refills | Status: AC
Start: 2022-10-19 — End: ?

## 2022-10-19 NOTE — Progress Notes (Signed)
[facility] [address] [place], [place] [address]  Internal MEDICINE  Telephone Visit  Patient Name: Jason Raymond  [id number]  [id number]  Date of Service: 10/19/2022  I connected with the patient at 1200 by telephone and verified the patients identity using two identifiers.   I discussed the limitations, risks, security and privacy concerns of performing an evaluation and management service by telephone and the availability of in person appointments. I also discussed with the patient that there may be a patient responsible charge related to the service.  The patient expressed understanding and agrees to proceed.    Chief Complaint  Patient presents with   Telephone Screen    [employer] FMLA form. [phone]   Telephone Assessment    HPI Jason Raymond presents for a telehealth virtual visit for COVID positive URI COVID positive last week, symptoms started on 10/14/2022. Symptoms have greatly improved as of today. Per his insurance, he is required to request FMLA for this type of absence from work so that he can continue to be paid.  He can return to work on 10/21/22.    Current Medication: Outpatient Encounter Medications as of 10/19/2022  Medication Sig   allopurinol (ZYLOPRIM) 100 MG tablet Take 1 tablet (100 mg total) by mouth daily.   benzonatate (TESSALON) 100 MG capsule Take 2 capsules (200 mg total) by mouth every 8 (eight) hours.   butalbital-acetaminophen-caffeine (FIORICET) 50-325-40 MG tablet Take 1 tablet by mouth every 6 (six) hours as needed for headache or migraine.   colchicine 0.6 MG tablet Take 1 tablet (0.6 mg total) by mouth daily.   cyclobenzaprine (FLEXERIL) 5 MG tablet Take 1 tablet (5 mg total) by mouth daily as needed for muscle spasms.   fluticasone (FLONASE) 50 MCG/ACT nasal spray PLACE 2 SPRAYS INTO BOTH NOSTRILS IN THE MORNING AND AT BEDTIME. *NEEDS APPT FOR REFILLS   ipratropium (ATROVENT) 0.06 % nasal spray Place 2 sprays into both nostrils 4 (four)  times daily.   montelukast (SINGULAIR) 10 MG tablet Take 1 tablet (10 mg total) by mouth at bedtime.   promethazine-dextromethorphan (PROMETHAZINE-DM) 6.25-15 MG/5ML syrup Take 5 mLs by mouth 4 (four) times daily as needed.   rosuvastatin (CRESTOR) 5 MG tablet Take 1 tablet (5 mg total) by mouth daily.   sildenafil (VIAGRA) 50 MG tablet Take 0.5-2 tablets (25-100 mg total) by mouth daily as needed for erectile dysfunction.   topiramate (TOPAMAX) 25 MG tablet Take 1 tablet (25 mg total) by mouth once a day.   [DISCONTINUED] SYRINGE-NEEDLE, DISP, 3 ML (B-D 3CC LUER-LOK SYR 21GX1") 21G X 1" 3 ML MISC Use 1 3-ml syringe with 21g x1" needle to administer IM testosterone cypionate every 28 days   [DISCONTINUED] testosterone cypionate (DEPOTESTOTERONE CYPIONATE) 100 MG/ML injection Inject 1 mL (100 mg total) into the muscle every 28 (twenty-eight) days. For IM use only   SYRINGE-NEEDLE, DISP, 3 ML (B-D 3CC LUER-LOK SYR 21GX1") 21G X 1" 3 ML MISC Use 1 3-ml syringe with 21g x1" needle to administer IM testosterone cypionate every 28 days   testosterone cypionate (DEPOTESTOTERONE CYPIONATE) 100 MG/ML injection Inject 1 mL (100 mg total) into the muscle every 14 (fourteen) days. For IM use only   No facility-administered encounter medications on file as of 10/19/2022.    Surgical History: Past Surgical History:  Procedure Laterality Date   DISTAL BICEPS TENDON REPAIR Right 09/01/2017   Procedure: DISTAL BICEPS TENDON REPAIR;  Surgeon: Deeann Saint, MD;  Location: [facility];  Service: Orthopedics;  Laterality: Right;   SHOULDER ARTHROSCOPY WITH DEBRIDEMENT AND BICEP TENDON REPAIR Right 12/08/2016   Procedure: SHOULDER ARTHROSCOPY WITH LYSIS OF ADHESIONS, AND  CAPSULOTOMY.;  Surgeon: Juanell Fairly, MD;  Location: [facility];  Service: Orthopedics;  Laterality: Right;   SHOULDER ARTHROSCOPY WITH OPEN ROTATOR CUFF REPAIR Right 06/27/2015   Procedure: SHOULDER ARTHROSCOPIC labral repair WITH MINI OPEN ROTATOR  CUFF REPAIR;  Surgeon: Juanell Fairly, MD;  Location: [facility];  Service: Orthopedics;  Laterality: Right;  Arthrocare emailed Rob Bagwell  Sleep Apnea YES  suppose to sleep with CPAP Machine but does not. 7-10 day Return     Medical History: Past Medical History:  Diagnosis Date   BMI 26.0-26.9,adult    Complication of anesthesia    GERD (gastroesophageal reflux disease)    no meds   Gout    Headache    MIGRAINES   History of kidney stones    PONV (postoperative nausea and vomiting)    naueseated AFTER 1ST SURGERY ONLY   Sleep apnea    NO CPAP-PT STATES HE HAS LOST WEIGHT AND HE THINKS THIS HAS HELPED     Family History: Family History  Problem Relation Age of Onset   Healthy Mother    Healthy Father     Social History   Socioeconomic History   Marital status: Married    Spouse name: Not on file   Number of children: Not on file   Years of education: Not on file   Highest education level: Not on file  Occupational History   Not on file  Tobacco Use   Smoking status: Never   Smokeless tobacco: Former    Types: Snuff  Vaping Use   Vaping Use: Never used  Substance and Sexual Activity   Alcohol use: Yes    Comment: BEER 2-4 times a month    Drug use: No   Sexual activity: Not on file  Other Topics Concern   Not on file  Social History Narrative   Not on file   Social Determinants of Health   Financial Resource Strain: Not on file  Food Insecurity: Not on file  Transportation Needs: Not on file  Physical Activity: Not on file  Stress: Not on file  Social Connections: Not on file  Intimate Partner Violence: Not on file      Review of Systems  Constitutional:  Positive for chills, fatigue and fever.  HENT:  Positive for congestion, postnasal drip, sinus pressure, sinus pain and sore throat.   Respiratory:  Positive for cough. Negative for chest tightness, shortness of breath and wheezing.   Cardiovascular: Negative.  Negative for chest pain and  palpitations.  Gastrointestinal: Negative.  Negative for abdominal pain, constipation, diarrhea, nausea and vomiting.  Neurological:  Positive for headaches.    Vital Signs: Resp 16   Ht 6\' 2"  (1.88 m)   Wt 216 lb (98 kg)   BMI 27.73 kg/m    Observation/Objective: He is alert and oriented and engages in conversation appropriately. No acute distress noted.     Assessment/Plan: 1. Upper respiratory tract infection due to COVID-19 virus FMLA paperwork completed, and may continue to take OTC medication for symptom relief. Quarantine from 10/14/22 to 10/20/22. May return to work on 10/21/22.   2. Hypogonadism in male Increase testosterone to every 14 days, continue periodic monitoring of labs every few months.  - testosterone cypionate (DEPOTESTOTERONE CYPIONATE) 100 MG/ML injection; Inject 1 mL (100 mg total) into the muscle every 14 (fourteen) days. For IM  use only  Dispense: 10 mL; Refill: 2 - SYRINGE-NEEDLE, DISP, 3 ML (B-D 3CC LUER-LOK SYR 21GX1") 21G X 1" 3 ML MISC; Use 1 3-ml syringe with 21g x1" needle to administer IM testosterone cypionate every 28 days  Dispense: 3 each; Refill: 1   General Counseling: Jason Raymond verbalizes understanding of the findings of today's phone visit and agrees with plan of treatment. I have discussed any further diagnostic evaluation that may be needed or ordered today. We also reviewed his medications today. he has been encouraged to call the office with any questions or concerns that should arise related to todays visit.  Return if symptoms worsen or fail to improve.   No orders of the defined types were placed in this encounter.   Meds ordered this encounter  Medications   testosterone cypionate (DEPOTESTOTERONE CYPIONATE) 100 MG/ML injection    Sig: Inject 1 mL (100 mg total) into the muscle every 14 (fourteen) days. For IM use only    Dispense:  10 mL    Refill:  2    dispense syringe-needle order with 10ml vial   SYRINGE-NEEDLE, DISP, 3 ML  (B-D 3CC LUER-LOK SYR 21GX1") 21G X 1" 3 ML MISC    Sig: Use 1 3-ml syringe with 21g x1" needle to administer IM testosterone cypionate every 28 days    Dispense:  3 each    Refill:  1    Dx code E29.1, dispense with testosterone cypionate    Time spent:20 Minutes Time spent with patient included reviewing progress notes, labs, imaging studies, and discussing plan for follow up.  [place] Controlled Substance Database was reviewed by me for overdose risk score (ORS) if appropriate.  This patient was seen by Sallyanne Kuster, FNP-C in collaboration with Dr. Beverely Risen as a part of collaborative care agreement.  Otho Michalik R. Tedd Sias, MSN, FNP-C Internal medicine

## 2022-10-20 ENCOUNTER — Encounter: Payer: Self-pay | Admitting: Nurse Practitioner

## 2022-10-20 ENCOUNTER — Telehealth: Payer: Self-pay | Admitting: Nurse Practitioner

## 2022-10-20 NOTE — Telephone Encounter (Signed)
Disability form completed. Faxed to [employer]; [phone]. Emailed to patient. Scanned-Toni

## 2022-10-21 ENCOUNTER — Telehealth: Payer: Self-pay | Admitting: Nurse Practitioner

## 2022-10-21 NOTE — Telephone Encounter (Signed)
Received another [employer] form 10/21/22 for medical records. Faxed back; 12 pages [phone]

## 2022-10-26 ENCOUNTER — Ambulatory Visit: Payer: BC Managed Care – PPO | Admitting: Nurse Practitioner

## 2022-12-25 ENCOUNTER — Other Ambulatory Visit: Payer: Self-pay | Admitting: Nurse Practitioner

## 2022-12-25 DIAGNOSIS — Z760 Encounter for issue of repeat prescription: Secondary | ICD-10-CM

## 2022-12-31 ENCOUNTER — Other Ambulatory Visit: Payer: Self-pay | Admitting: Nurse Practitioner

## 2022-12-31 DIAGNOSIS — M10072 Idiopathic gout, left ankle and foot: Secondary | ICD-10-CM

## 2022-12-31 DIAGNOSIS — M10021 Idiopathic gout, right elbow: Secondary | ICD-10-CM

## 2023-03-11 DIAGNOSIS — E559 Vitamin D deficiency, unspecified: Secondary | ICD-10-CM | POA: Diagnosis not present

## 2023-03-11 DIAGNOSIS — M25512 Pain in left shoulder: Secondary | ICD-10-CM | POA: Diagnosis not present

## 2023-03-11 DIAGNOSIS — E291 Testicular hypofunction: Secondary | ICD-10-CM | POA: Diagnosis not present

## 2023-03-11 DIAGNOSIS — Z131 Encounter for screening for diabetes mellitus: Secondary | ICD-10-CM | POA: Diagnosis not present

## 2023-03-11 DIAGNOSIS — Z1329 Encounter for screening for other suspected endocrine disorder: Secondary | ICD-10-CM | POA: Diagnosis not present

## 2023-03-11 DIAGNOSIS — Z1159 Encounter for screening for other viral diseases: Secondary | ICD-10-CM | POA: Diagnosis not present

## 2023-03-11 DIAGNOSIS — Z136 Encounter for screening for cardiovascular disorders: Secondary | ICD-10-CM | POA: Diagnosis not present

## 2023-03-11 DIAGNOSIS — Z8739 Personal history of other diseases of the musculoskeletal system and connective tissue: Secondary | ICD-10-CM | POA: Diagnosis not present

## 2023-03-11 DIAGNOSIS — Z13220 Encounter for screening for lipoid disorders: Secondary | ICD-10-CM | POA: Diagnosis not present

## 2023-03-15 ENCOUNTER — Other Ambulatory Visit: Payer: Self-pay | Admitting: Nurse Practitioner

## 2023-03-15 DIAGNOSIS — Z760 Encounter for issue of repeat prescription: Secondary | ICD-10-CM

## 2023-05-04 ENCOUNTER — Telehealth: Payer: Self-pay | Admitting: Nurse Practitioner

## 2023-05-04 NOTE — Telephone Encounter (Signed)
 MB full, sent mychart msg to move 07/28/23 appointment-Toni

## 2023-06-08 DIAGNOSIS — N529 Male erectile dysfunction, unspecified: Secondary | ICD-10-CM | POA: Diagnosis not present

## 2023-06-08 DIAGNOSIS — E291 Testicular hypofunction: Secondary | ICD-10-CM | POA: Diagnosis not present

## 2023-06-08 DIAGNOSIS — E559 Vitamin D deficiency, unspecified: Secondary | ICD-10-CM | POA: Diagnosis not present

## 2023-06-08 DIAGNOSIS — Z0000 Encounter for general adult medical examination without abnormal findings: Secondary | ICD-10-CM | POA: Diagnosis not present

## 2023-06-21 DIAGNOSIS — E291 Testicular hypofunction: Secondary | ICD-10-CM | POA: Diagnosis not present

## 2023-06-21 DIAGNOSIS — Z8739 Personal history of other diseases of the musculoskeletal system and connective tissue: Secondary | ICD-10-CM | POA: Diagnosis not present

## 2023-07-28 ENCOUNTER — Encounter: Payer: BC Managed Care – PPO | Admitting: Nurse Practitioner

## 2023-08-19 DIAGNOSIS — E291 Testicular hypofunction: Secondary | ICD-10-CM | POA: Diagnosis not present

## 2023-09-03 DIAGNOSIS — E291 Testicular hypofunction: Secondary | ICD-10-CM | POA: Diagnosis not present

## 2023-09-03 DIAGNOSIS — E663 Overweight: Secondary | ICD-10-CM | POA: Diagnosis not present

## 2023-09-03 DIAGNOSIS — Z8739 Personal history of other diseases of the musculoskeletal system and connective tissue: Secondary | ICD-10-CM | POA: Diagnosis not present

## 2023-09-03 DIAGNOSIS — Z6829 Body mass index (BMI) 29.0-29.9, adult: Secondary | ICD-10-CM | POA: Diagnosis not present

## 2023-10-01 DIAGNOSIS — R42 Dizziness and giddiness: Secondary | ICD-10-CM | POA: Diagnosis not present

## 2023-11-17 DIAGNOSIS — E291 Testicular hypofunction: Secondary | ICD-10-CM | POA: Diagnosis not present

## 2023-11-24 DIAGNOSIS — E291 Testicular hypofunction: Secondary | ICD-10-CM | POA: Diagnosis not present

## 2024-01-31 ENCOUNTER — Other Ambulatory Visit: Payer: Self-pay | Admitting: Nurse Practitioner

## 2024-01-31 DIAGNOSIS — M10021 Idiopathic gout, right elbow: Secondary | ICD-10-CM

## 2024-01-31 DIAGNOSIS — M10072 Idiopathic gout, left ankle and foot: Secondary | ICD-10-CM
